# Patient Record
Sex: Female | Born: 1995
Health system: Southern US, Community
[De-identification: ages and names within clinical notes are randomized; demographics above are authoritative.]

## PROBLEM LIST (undated history)

## (undated) DIAGNOSIS — R569 Unspecified convulsions: Secondary | ICD-10-CM

## (undated) HISTORY — DX: Unspecified convulsions: R56.9

## (undated) HISTORY — PX: EYE SURGERY: SHX253

---
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
## 2015-03-07 ENCOUNTER — Other Ambulatory Visit: Payer: Self-pay | Admitting: Otolaryngology

## 2015-03-07 DIAGNOSIS — R42 Dizziness and giddiness: Secondary | ICD-10-CM

## 2015-03-12 ENCOUNTER — Ambulatory Visit
Admission: RE | Admit: 2015-03-12 | Discharge: 2015-03-12 | Disposition: A | Discharge status: Home / Self Care | Payer: BLUE CROSS/BLUE SHIELD | Source: Ambulatory Visit | Attending: Otolaryngology | Admitting: Otolaryngology

## 2015-03-12 DIAGNOSIS — R42 Dizziness and giddiness: Secondary | ICD-10-CM | POA: Insufficient documentation

## 2015-03-12 DIAGNOSIS — J3489 Other specified disorders of nose and nasal sinuses: Secondary | ICD-10-CM | POA: Diagnosis not present

## 2015-03-12 MED ORDER — GADOBENATE DIMEGLUMINE 529 MG/ML IV SOLN
20.0000 mL | Freq: Once | INTRAVENOUS | Status: AC | PRN
  Administered 2015-03-12: 20 mL via INTRAVENOUS

## 2015-05-17 DIAGNOSIS — G40909 Epilepsy, unspecified, not intractable, without status epilepticus: Secondary | ICD-10-CM | POA: Insufficient documentation

## 2015-05-23 ENCOUNTER — Ambulatory Visit: Payer: BLUE CROSS/BLUE SHIELD | Admitting: Physical Therapy

## 2015-05-30 ENCOUNTER — Ambulatory Visit: Payer: BLUE CROSS/BLUE SHIELD | Attending: Neurology | Admitting: Physical Therapy

## 2015-05-30 ENCOUNTER — Encounter: Payer: Self-pay | Admitting: Physical Therapy

## 2015-05-30 DIAGNOSIS — R2689 Other abnormalities of gait and mobility: Secondary | ICD-10-CM | POA: Diagnosis present

## 2015-05-30 DIAGNOSIS — R42 Dizziness and giddiness: Secondary | ICD-10-CM | POA: Diagnosis present

## 2015-05-30 NOTE — Addendum Note (Signed)
Addended by: Leanor Rubenstein on: 05/30/2015 05:16 PM   Modules accepted: Orders

## 2015-05-30 NOTE — Therapy (Addendum)
[place] [facility] MAIN [facility] SERVICES [address] [place], [place], [address] Phone: [phone]   Fax:  [phone]  Physical Therapy Evaluation  Patient Details  Name: Jacqueline Krueger MRN: [id number] Date of Birth: 1996-02-09 Referring Provider:  Lonell Face, MD  Encounter Date: 05/30/2015      PT End of Session - 05/30/15 1559    Visit Number 1   Number of Visits 8   Date for PT Re-Evaluation 07/25/15   PT Start Time 1105   PT Stop Time 1202   PT Time Calculation (min) 57 min   Equipment Utilized During Treatment Gait belt   Activity Tolerance Patient tolerated treatment well   Behavior During Therapy [facility] for tasks assessed/performed      Past Medical History  Diagnosis Date  . Seizures     History reviewed. No pertinent past surgical history.  There were no vitals filed for this visit.  Visit Diagnosis:  Dizziness and giddiness - Plan: PT plan of care cert/re-cert  Imbalance - Plan: PT plan of care cert/re-cert      Subjective Assessment - 05/30/15 1645    Subjective Pt reports that she can be lying still and the dizziness comes on. Pt's mother reports that pt can get dizzy walking into stores and needs help to walk. Pt reports last seizure was when she was 3 or 19 years old. Pt was diagnosed with Grand mal seizures. Pt use to get headaches and use to take medication for migraines so this medication was stopped due to heart issues. Pt reports difficulties with bending, standing, walking and standing up. Pt reports her symptoms get worse as the day progresses. Pt reports that her legs get "spasy" when she tries to exercise.   Patient is accompained by: Family member  mother   Diagnostic tests MRI brain 03/12/15 that was normal per MR. VNG testing 03/07/15 that indicated central findings per MR.  Pt reports she has an EEG test appt tomorrow.    Patient Stated Goals Pt would like to be able to work, exercise and go shopping.       VESTIBULAR AND BALANCE EVALUATION  Onset Date: age 47 or 5  HISTORY:  Symptoms at onset: Pt reports dizziness since the age of 89 or 5 and states it has gotten worse. Pt has not had any prior treatment for dizziness.  Pt reports that she can be lying still and the dizziness comes on. Pt's mother reports that pt can get dizzy walking into stores and needs help to walk. Pt reports last seizure was when she was 30 or 19 years old. Pt was diagnosed with Grand mal seizures. Pt use to get headaches and use to take medication for migraines so this medication was stopped due to heart issues. Pt reports difficulties with bending, standing, walking and standing up. Pt reports her symptoms get worse as the day progresses. Pt reports that her legs get "spasy" when she tries to exercise. Pt is going for EEG test tomorrow morning.   Description of dizziness: difficulty walking, vertigo, nausea, unsteadiness, lightheadedness Symptom nature: (motion provoked/positional/spontaneous/constant, variable, intermittent)  Frequency: reports 3 times this week she had episodes of dizziness Duration: lasts hours states until she goes to sleep   Provocative Factors: nothing that she knows of; once stood up yesterday Easing Factors: sleep (felt no difference since the patch usage) Progression of symptoms: (better, worse, no change since onset) Falls (yes/no): yes Number of falls in past 6 months:  one  fall going out the door denies dizziness at the time. Pt is afraid of falling.  Subjective Prior Functional Level: Pt does not drive due to dizziness. Pt reports independence with ADLs and at school. Pt does report that she gets leg spasms during showering at times and she calls for assistance.   Auditory complaints (tinnitus, pain, drainage): denies Vision: (last eye exam, diplopia, recent changes) blurry vision states it coincided with use of the scopolamine patch.   Current Symptoms: (vertigo, N & V, dysarthria, dysphagia,  drop attacks, bowel and bladder changes, recent weight loss/gain, lightheadedness, headache, rocking, general unsteadiness, imbalance, tilting, swaying, veering, dizzy, oscillopsia, migraines) Review of systems negative for red flags.   Prior LOF: Pt is independent community ambulator without AD and Independent with ADLs and activities at home. Pt has not gotten a drivers license yet due to her issues with dizziness. Pt lives with her family in a mobile home with no steps to enter.   EXAMINATION:   NEUROLOGICAL SCREEN:  B UEs and LEs myotome and dermatome testing WNL.    SOMATOSENSORY:        Sensation           Intact      Diminished         Absent  Light touch Normal     Any N & T in extremities or weakness: denies      COORDINATION:Finger to Nose:  Normal Past Pointing:   mild pass pointing bilaterally   MUSCULOSKELETAL SCREEN: Cervical Spine ROM: Cervical AROM grossly WNL without pain or crepitus.   ROM: WNL AROM B LEs and UEs.  MMT:  Grossly -5/5 or greater throughout except 4/5 bilateral shoulder flexion    Gait: Pt ambulates with fair cadence without AD without LOB or veering. However, pt ambulates with head looking down at floor with decreased arm swing. Pt with mild gait deviations with horiz head turns during ambulation.  Scanning of visual environment with gait is: decreased  Balance: Pt demonstrates imbalance with narrow BOS, ambulation with horiz head turns and uneven, compliant surfaces.       Clinical Test of Sensory Interaction for Balance    (CTSIB):  CONDITION TIME STRATEGY SWAY  Eyes open, firm surface 30 sec    Eyes closed, firm surface 30 sec ankle +1   Eyes open, foam surface 30 sec ankle   Eyes closed, foam surface 30 sec ankle +2    OCULOMOTOR / VESTIBULAR TESTING:  Oculomotor Exam- Room Light  Normal Abnormal Comments  Ocular Alignment N    Ocular ROM N    Spontaneous Nystagmus N    End-Gaze Nystagmus  ABN Persistent, downbeating  nystagmus noted at L and R mid to end range  Smooth Pursuit  ABN Saccadic throughout  Saccades  ABN Nystagmus noted and appears to go past target and correct.   VOR  ABN DIZZINESS AND NOTED DIFFICULTY MAINTAINING TARGET ESPECIALLY WITH HEAD TURNS TO THE RIGHT WITH SIGNIFICANT PASS POINTING NOTED  VOR Cancellation  ABN BLURRING OF TARGET AND DIZZINESS NOTED  Head Shaking Nystagmus N  No nystagmus observed; pt does report dizziness   BPPV TESTS: Deferred secondary to negative VNG for BPPV tests per MR performed 03/07/2015.   MEASURES:  Results Comments  DHI 92 Severe perception of handicap; in need of intervention  ABC Scale 18.1% High falls risk; in need of intervention  DGI 21/24 Falls risk; in need of intervention           [facility] PT  Assessment - 05/30/15 0001    Assessment   Medical Diagnosis dizziness   Prior Therapy none   Precautions   Precautions Fall   Balance Screen   Has the patient fallen in the past 6 months Yes   How many times? 1            PT Education - 05/30/15 1558    Education provided Yes   Education Details Discussed and demonstrated VOR X 1. Added VOR X 1 and feet together on pillow with vert head turns to HEP and handout provided.    Person(s) Educated Patient;Parent(s)   Methods Explanation;Demonstration;Handout   Comprehension Verbalized understanding;Returned demonstration             PT Long Term Goals - 05/30/15 1604    PT LONG TERM GOAL #1   Title Patient will reduce falls risk as indicated by Activities Specific Balance Confidence Scale (ABC) >67%.   Baseline 05/30/15: scored 18.1%    Time 8   Period Weeks   Status New   PT LONG TERM GOAL #2   Title Patient will reduce perceived disability to moderate levels as indicated by <70 on Dizziness Handicap Inventory [facility]).   Baseline 05/30/2015: 92 severe perception of handicap   Time 8   Period Weeks   Status New   PT LONG TERM GOAL #3   Title Patient will be able to perform home  program independently for self-management.   Time 8   Period Weeks   Status New   PT LONG TERM GOAL #4   Title Patient reports no vertigo with provoking motions or positions.   Time 8   Period Weeks   Status New   PT LONG TERM GOAL #5   Title Patient will demonstrate reduced falls risk as evidenced by Dynamic Gait Index (DGI) 22/24 or greater.   Time 8   Period Weeks   Status New               Plan - 05/30/15 1602    Clinical Impression Statement Pt presents with listed deficits and several potential indicators of central signs with vestibular testing. Pt with evidence of imbalance with narrow BOS, uneven surfaces and ambulation with head turn activities. Pt is at risk for falls and would benefit from PT services to address deficits and goals as set on POC.     Pt will benefit from skilled therapeutic intervention in order to improve on the following deficits Decreased balance;Dizziness;Decreased coordination   Rehab Potential Fair   Clinical Impairments Affecting Rehab Potential Positive indicators; supportive mother, motivated  Negative indicators: potential central signs, chronicity, stressors   PT Frequency 1x / week   PT Duration 8 weeks   PT Treatment/Interventions Vestibular;Canalith Repostioning;Patient/family education;Neuromuscular re-education;Balance training   PT Next Visit Plan Review VOR X 1; work on progressions of gaze stability and vestibulo-ocular tests.    Consulted and Agree with Plan of Care Patient;Family member/caregiver   Family Member Consulted mother        Problem List There are no active problems to display for this patient.  Mardelle Matte PT, DPT Mardelle Matte 05/30/2015, 5:15 PM  [place] [facility] MAIN [facility] SERVICES [address] [place], [place], [address] Phone: [phone]   Fax:  [phone]

## 2015-06-03 ENCOUNTER — Ambulatory Visit: Payer: BLUE CROSS/BLUE SHIELD | Admitting: Physical Therapy

## 2015-06-10 ENCOUNTER — Encounter: Payer: Self-pay | Admitting: Physical Therapy

## 2015-06-10 ENCOUNTER — Ambulatory Visit: Payer: BLUE CROSS/BLUE SHIELD

## 2015-06-10 DIAGNOSIS — R42 Dizziness and giddiness: Secondary | ICD-10-CM

## 2015-06-10 DIAGNOSIS — R2689 Other abnormalities of gait and mobility: Secondary | ICD-10-CM

## 2015-06-10 NOTE — Patient Instructions (Addendum)
Feet Together, Head Motion - Eyes Open   With eyes open, feet together, move head slowly: left and right. Eyes should follow head. Perform for 60 seconds Repeat _3__ times per session. Do __2__ sessions per day.  Also issued: Seated horizontal saccades 60 seconds x 3, twice/day  Modified VOR x 1 horizontal to NBOS standing

## 2015-06-10 NOTE — Therapy (Signed)
[place] [facility] MAIN [facility] SERVICES [address] [place], [place], [address] Phone: [phone]   Fax:  [phone]  Physical Therapy Treatment  Patient Details  Name: Jerzy Crotteau MRN: [id number] Date of Birth: [birth year] Referring Lennin Osmond:  Lonell Face, MD  Encounter Date: 06/10/2015      PT End of Session - 06/10/15 1357    Visit Number 2   Number of Visits 8   Date for PT Re-Evaluation 07/25/15   PT Start Time 1435   PT Stop Time 1515   PT Time Calculation (min) 40 min   Equipment Utilized During Treatment Gait belt   Activity Tolerance Patient tolerated treatment well   Behavior During Therapy [facility] for tasks assessed/performed      Past Medical History  Diagnosis Date  . Seizures     History reviewed. No pertinent past surgical history.  There were no vitals filed for this visit.  Visit Diagnosis:  Dizziness and giddiness  Imbalance      Subjective Assessment - 06/10/15 1357    Subjective Pt states that she had the EEG performed since the initial evaluation but was not provided the results. Pt is unsure if and when she will receive the results and does not have any follow-up appointments scheduled with her MD. Pt states she was able to perform HEP twice/day with some reported dizziness. Pt reports 4/10 dizziness with VOR x 1. No specific questions or concerns at this time.    Patient is accompained by: Family member  mother   Pertinent History Pt reports that she can be lying still and the dizziness comes on. Pt's mother reports that pt can get dizzy walking into stores and needs help to walk. Pt reports last seizure was when she was 19 or 19 years old. Pt was diagnosed with Grand mal seizures. Pt use to get headaches and use to take medication for migraines so this medication was stopped due to heart issues. Pt reports difficulties with bending, standing, walking and standing up. Pt reports her symptoms get worse as the day  progresses. Pt reports that her legs get "spasy" when she tries to exercise.   Diagnostic tests MRI brain 03/12/15 that was normal per MR. VNG testing 03/07/15 that indicated central findings per MR.  Pt reports she has an EEG test appt tomorrow.    Patient Stated Goals Pt would like to be able to work, exercise and go shopping.    Currently in Pain? No/denies       TREATMENT  VOR Performed VOR x 1 horizontal in sitting x 60 seconds to review with patient. Patient demonstrates good form and technique. Reports 4/10 dizziness; Performed VOR x 1 horizontal NBOS on Airex pad 60 seconds x 2 (6/10);  AIREX NBOS horizontal and vertical head turns 60 sec each, NBOS horizontal head and body turns 60 seconds x 2;  EYE EXERCISES Performed horizontal saccades between 2 "x" marks on the wall with 1-2 second holds on each mark 60 seconds x 2; Horizontal followed by vertical smooth pursuit with eyes x 60 seconds each;  AMBULATION Performed forward and retro ambulation with quick head turns reading playing cards on wall x 2 laps each; Pt has difficulty focusing on cards and naming correctly (pt requires instruction on suits on cards as she does not play with cards); Forward ambulation with vertical and then lateral ball toss;  Education regarding HEP progression.  PT Education - 06/10/15 1357    Education provided Yes   Education Details Progression of HEP   Person(s) Educated Patient   Methods Explanation;Demonstration;Handout   Comprehension Verbalized understanding;Returned demonstration             PT Long Term Goals - 05/30/15 1604    PT LONG TERM GOAL #1   Title Patient will reduce falls risk as indicated by Activities Specific Balance Confidence Scale (ABC) >67%.   Baseline 05/30/15: scored 18.1%    Time 8   Period Weeks   Status New   PT LONG TERM GOAL #2   Title Patient will reduce perceived disability to moderate levels as indicated  by <70 on Dizziness Handicap Inventory [facility]).   Baseline 05/30/2015: 92 severe perception of handicap   Time 8   Period Weeks   Status New   PT LONG TERM GOAL #3   Title Patient will be able to perform home program independently for self-management.   Time 8   Period Weeks   Status New   PT LONG TERM GOAL #4   Title Patient reports no vertigo with provoking motions or positions.   Time 8   Period Weeks   Status New   PT LONG TERM GOAL #5   Title Patient will demonstrate reduced falls risk as evidenced by Dynamic Gait Index (DGI) 22/24 or greater.   Time 8   Period Weeks   Status New               Plan - 06/10/15 1358    Clinical Impression Statement Pt demonstrates very poor smooth pursuit and saccades. She reports 6/10 dizziness with standing VOR on Airex pad. Pt issued additional exercises to HEP and encouraged to follow-up as scheduled.    Pt will benefit from skilled therapeutic intervention in order to improve on the following deficits Decreased balance;Dizziness;Decreased coordination   Rehab Potential Fair   Clinical Impairments Affecting Rehab Potential Positive indicators; supportive mother, motivated  Negative indicators: potential central signs, chronicity, stressors   PT Frequency 1x / week   PT Duration 8 weeks   PT Treatment/Interventions Vestibular;Canalith Repostioning;Patient/family education;Neuromuscular re-education;Balance training   PT Next Visit Plan Assess dynamic visual acuity on snellen eye chart with head turns. Provide progressions of gaze stability.   PT Home Exercise Plan VOR x 1 horizontal NBOS, NBOS horizontal head turns, standing horizontal saccades between 2 objects.    Consulted and Agree with Plan of Care Patient;Family member/caregiver   Family Member Consulted mother        Problem List There are no active problems to display for this patient.  Lynnea Maizes PT, DPT   Huprich,Jason 06/10/2015, 4:50 PM  [place] [facility] MAIN [facility] SERVICES [address] [place], [place], [address] Phone: [phone]   Fax:  [phone]

## 2015-06-17 ENCOUNTER — Ambulatory Visit: Payer: BLUE CROSS/BLUE SHIELD | Attending: Neurology

## 2015-06-17 DIAGNOSIS — R2689 Other abnormalities of gait and mobility: Secondary | ICD-10-CM | POA: Insufficient documentation

## 2015-06-17 DIAGNOSIS — R42 Dizziness and giddiness: Secondary | ICD-10-CM | POA: Diagnosis not present

## 2015-06-17 NOTE — Therapy (Signed)
[facility] MAIN [facility] SERVICES [address] [place], [place], [address] Phone: [phone]   Fax:  [phone]  Physical Therapy Treatment  Patient Details  Name: Jacqueline Krueger MRN: [id number] Date of Birth: 07/27/1996 Referring Provider:  Lonell Face, MD  Encounter Date: 06/17/2015      PT End of Session - 06/17/15 1424    Visit Number 3   Number of Visits 8   Date for PT Re-Evaluation 07/25/15   PT Start Time 1430   PT Stop Time 1515   PT Time Calculation (min) 45 min   Equipment Utilized During Treatment Gait belt   Activity Tolerance Patient tolerated treatment well   Behavior During Therapy [facility] for tasks assessed/performed      Past Medical History  Diagnosis Date  . Seizures (HCC)     History reviewed. No pertinent past surgical history.  There were no vitals filed for this visit.  Visit Diagnosis:  Dizziness and giddiness  Imbalance      Subjective Assessment - 06/17/15 1423    Subjective Pt reports she is doing well today without reported dizziness at rest. She is performing HEP without issue and has integrated the new exercises into her program. No specific questions or concerns at this time.    Patient is accompained by: Family member  sister   Pertinent History Pt reports that she can be lying still and the dizziness comes on. Pt's mother reports that pt can get dizzy walking into stores and needs help to walk. Pt reports last seizure was when she was 22 or 19 years old. Pt was diagnosed with Grand mal seizures. Pt use to get headaches and use to take medication for migraines so this medication was stopped due to heart issues. Pt reports difficulties with bending, standing, walking and standing up. Pt reports her symptoms get worse as the day progresses. Pt reports that her legs get "spasy" when she tries to exercise.   Diagnostic tests MRI brain 03/12/15 that was normal per MR. VNG testing 03/07/15 that indicated  central findings per MR.  Pt reports she has an EEG test appt tomorrow.    Patient Stated Goals Pt would like to be able to work, exercise and go shopping.    Currently in Pain? No/denies        TREATMENT  VOR Performed VOR x 1 horizontal NBOS on Airex pad with conflicting background 60 seconds x 2 (3/10), cues to increase speed as first repetition pt reports 0/10 dizziness. Pt denies blurring or shifting of her visual field; VOR x 1 with forward and retro ambulation in gym 35' x 2 (5/10); Pt demonstrates mild to moderate lateral gait deviations with change in cadence and speed with balance disruption;  VOR x 2 horizontal x 60 seconds (3/10);  AIREX NBOS horizontal and vertical head turns x 30 sec each, eyes closed static x 30 seconds, NBOS horizontal head and body turns x 30 seconds; marching with horizontal head turns x 30 seconds;  EYE EXERCISES Performed horizontal saccades between 2 "x" marks on the wall with 1-2 second holds on each mark 60 seconds, vertical saccades x 60 seconds, diagonal saccades 60 seconds x 2; significant nystagmus noted during horizontal and diagonal exercises. Minimal noted during vertical gaze.   Assessed static and dynamic visual acuity: 20/13 static visual acuity, 20/50 dynamic visual acuity  AMBULATION Performed forward and retro ambulation with quick head turns reading playing cards on wall x 2 laps each; Pt with  less difficulty focusing on cards and naming correctly on this date. Forward ambulation with vertical and then lateral ball toss; retro ambulation with ball pass over shoulder at varying heights (5/10). Ambulation in hallway with quick turns, LOB with therapist correction during quick turns.  Education regarding HEP progression.                           PT Education - 06/17/15 1424    Education provided Yes   Education Details HEP Progression   Person(s) Educated Patient   Methods Explanation;Demonstration;Handout    Comprehension Verbalized understanding;Returned demonstration             PT Long Term Goals - 05/30/15 1604    PT LONG TERM GOAL #1   Title Patient will reduce falls risk as indicated by Activities Specific Balance Confidence Scale (ABC) >67%.   Baseline 05/30/15: scored 18.1%    Time 8   Period Weeks   Status New   PT LONG TERM GOAL #2   Title Patient will reduce perceived disability to moderate levels as indicated by <70 on Dizziness Handicap Inventory [facility]).   Baseline 05/30/2015: 92 severe perception of handicap   Time 8   Period Weeks   Status New   PT LONG TERM GOAL #3   Title Patient will be able to perform home program independently for self-management.   Time 8   Period Weeks   Status New   PT LONG TERM GOAL #4   Title Patient reports no vertigo with provoking motions or positions.   Time 8   Period Weeks   Status New   PT LONG TERM GOAL #5   Title Patient will demonstrate reduced falls risk as evidenced by Dynamic Gait Index (DGI) 22/24 or greater.   Time 8   Period Weeks   Status New               Plan - 06/17/15 1425    Clinical Impression Statement Pt reports gradually improving dizziness at home with HEP. She reports at worst 5/10 dizziness and this occurs with retroambulation with ball passes over shoulder as well as with retro ambulation VOR x 1 horizontal. >3 line difference in static vs dynamic visual acuity. Pt issued progression of HEP to include foam surface and conflicting background. Will progress HEP at next visit if patient tolerates current program. Pt will continue to benefit from skilled PT services to address deficits and improve function at home.    Pt will benefit from skilled therapeutic intervention in order to improve on the following deficits Decreased balance;Dizziness;Decreased coordination   Rehab Potential Fair   Clinical Impairments Affecting Rehab Potential Positive indicators; supportive mother, motivated  Negative  indicators: potential central signs, chronicity, stressors   PT Frequency 1x / week   PT Duration 8 weeks   PT Treatment/Interventions Vestibular;Canalith Repostioning;Patient/family education;Neuromuscular re-education;Balance training   PT Next Visit Plan Progression of adaptation and habituation exercises. Issue VOR with ambulation at next visit for HEP if patient able to safely perform. Continue quick turns   PT Home Exercise Plan VOR x 1 horizontal NBOS foam with conflicting background, NBOS foam with horizontal head and body turns, seated diagonal saccades between 2 objects.    Consulted and Agree with Plan of Care Patient;Family member/caregiver   Family Member Consulted mother        Problem List There are no active problems to display for this patient.  Sharalyn Ink Huprich PT,  DPT   Huprich,Jason 06/17/2015, 3:59 PM  [facility] MAIN [facility] SERVICES [address] [place], [place], [address] Phone: [phone]   Fax:  [phone]

## 2015-06-24 ENCOUNTER — Ambulatory Visit: Payer: BLUE CROSS/BLUE SHIELD | Admitting: Physical Therapy

## 2015-06-25 ENCOUNTER — Ambulatory Visit: Payer: BLUE CROSS/BLUE SHIELD

## 2015-06-25 DIAGNOSIS — R42 Dizziness and giddiness: Secondary | ICD-10-CM

## 2015-06-25 DIAGNOSIS — R2689 Other abnormalities of gait and mobility: Secondary | ICD-10-CM

## 2015-06-25 NOTE — Therapy (Signed)
[facility] MAIN [facility] SERVICES [address] [place], [place], [address] Phone: [phone]   Fax:  [phone]  Physical Therapy Treatment  Patient Details  Name: Jacqueline Krueger MRN: [id number] Date of Birth: Jan 11, 1996 Referring Provider:  Lonell Face, MD  Encounter Date: 06/25/2015      PT End of Session - 06/25/15 1041    Visit Number 4   Number of Visits 8   Date for PT Re-Evaluation 07/25/15   PT Start Time 1038   PT Stop Time 1120   PT Time Calculation (min) 42 min   Equipment Utilized During Treatment Gait belt   Activity Tolerance Patient tolerated treatment well   Behavior During Therapy [facility] for tasks assessed/performed      Past Medical History  Diagnosis Date  . Seizures (HCC)     History reviewed. No pertinent past surgical history.  There were no vitals filed for this visit.  Visit Diagnosis:  Dizziness and giddiness  Imbalance      Subjective Assessment - 06/25/15 1040    Subjective Pt states she is doing well on this date. She is performing HEP without issue and reports positive reproduction of dizziness. Pt states that her symptoms feels about hte same as when she started therapy. No specific questions or concerns at this time.    Patient is accompained by: Family member  sister   Pertinent History Pt reports that she can be lying still and the dizziness comes on. Pt's mother reports that pt can get dizzy walking into stores and needs help to walk. Pt reports last seizure was when she was 27 or 19 years old. Pt was diagnosed with Grand mal seizures. Pt use to get headaches and use to take medication for migraines so this medication was stopped due to heart issues. Pt reports difficulties with bending, standing, walking and standing up. Pt reports her symptoms get worse as the day progresses. Pt reports that her legs get "spasy" when she tries to exercise.   Diagnostic tests MRI brain 03/12/15 that was normal per  MR. VNG testing 03/07/15 that indicated central findings per MR.  Pt reports she has an EEG test appt tomorrow.    Patient Stated Goals Pt would like to be able to work, exercise and go shopping.        TREATMENT  VOR Performed VOR x 1 horizontal NBOS on Airex pad with conflicting background 60 seconds x 2 (5/10), good speed and sequencing noted. Pt denies blurring or shifting of her visual field; VOR x 1 vertical NBOS Airex pad x 60 seconds (5/10); VOR x 1 horizontal with forward and retro ambulation in gym 35' x 2 (3/10); VOR x 1 vertical with forward and retro ambulation in gym 35' x 2 (5/10); Pt demonstrates mild lateral gait deviations with change in cadence and speed with balance disruption; worse with vertical;  DYNAMIC GAZE STABILIZATION Forward and retro ambulation in hallway with quick head turns to read playing cards (3/10)  AMBULATION Forward ambulation with vertical and then lateral ball toss; retro ambulation with vertical ball toss; retro ambulation with ball pass over shoulder at varying heights (5/10). Ambulation in hallway with quick 180 degree turns in both directions, no gross LOB noted today but pt consistently requires 1 step to self-correct.  BALL TOSS Standing in doorway of lifestyle center ball toss from side to side with quick head turns following with eyes 30 seconds x 3, added high to low progression; Pt denies dizziness  initially but with low to the R and high to the L reports 4/10 dizziness.   AIREX Forward and backward tandem gait on Airex balance beam, added horizontal head turns to tandem gait; side stepping on Airex balance beam with horizontal head turns;                           PT Education - 06/25/15 1040    Education provided Yes   Education Details HEP reinforced and progressed   Person(s) Educated Patient   Methods Explanation;Demonstration   Comprehension Verbalized understanding;Returned demonstration              PT Long Term Goals - 05/30/15 1604    PT LONG TERM GOAL #1   Title Patient will reduce falls risk as indicated by Activities Specific Balance Confidence Scale (ABC) >67%.   Baseline 05/30/15: scored 18.1%    Time 8   Period Weeks   Status New   PT LONG TERM GOAL #2   Title Patient will reduce perceived disability to moderate levels as indicated by <70 on Dizziness Handicap Inventory [facility]).   Baseline 05/30/2015: 92 severe perception of handicap   Time 8   Period Weeks   Status New   PT LONG TERM GOAL #3   Title Patient will be able to perform home program independently for self-management.   Time 8   Period Weeks   Status New   PT LONG TERM GOAL #4   Title Patient reports no vertigo with provoking motions or positions.   Time 8   Period Weeks   Status New   PT LONG TERM GOAL #5   Title Patient will demonstrate reduced falls risk as evidenced by Dynamic Gait Index (DGI) 22/24 or greater.   Time 8   Period Weeks   Status New               Plan - 06/25/15 1041    Clinical Impression Statement Pt reports less severe dizziness during therapy today but still notable and persistent. Provided progression of HEP on this date to include VOR x 1 horizontal with forward/retro ambulation. Pt encouraged to follow-up as scheduled.   Pt will benefit from skilled therapeutic intervention in order to improve on the following deficits Decreased balance;Dizziness;Decreased coordination   Rehab Potential Fair   Clinical Impairments Affecting Rehab Potential Positive indicators; supportive mother, motivated  Negative indicators: potential central signs, chronicity, stressors   PT Frequency 1x / week   PT Duration 8 weeks   PT Treatment/Interventions Vestibular;Canalith Repostioning;Patient/family education;Neuromuscular re-education;Balance training   PT Next Visit Plan Progression of adaptation and habituation exercises.    PT Home Exercise Plan VOR x 1 horizontal with forward/retro  ambulation, NBOS foam with horizontal head and body turns, seated diagonal saccades between 2 objects.    Consulted and Agree with Plan of Care Patient;Family member/caregiver   Family Member Consulted mother        Problem List There are no active problems to display for this patient.  Lynnea Maizes PT, DPT   Huprich,Jason 06/25/2015, 2:55 PM  [facility] MAIN [facility] SERVICES [address] [place], [place], [address] Phone: [phone]   Fax:  [phone]

## 2015-07-01 ENCOUNTER — Encounter: Payer: Self-pay | Admitting: Physical Therapy

## 2015-07-01 ENCOUNTER — Ambulatory Visit: Payer: BLUE CROSS/BLUE SHIELD | Admitting: Physical Therapy

## 2015-07-01 DIAGNOSIS — R2689 Other abnormalities of gait and mobility: Secondary | ICD-10-CM

## 2015-07-01 DIAGNOSIS — R42 Dizziness and giddiness: Secondary | ICD-10-CM | POA: Diagnosis not present

## 2015-07-01 NOTE — Therapy (Signed)
[facility]IN [facility] [address] [place], [place], [address] Phone: [phone]   Fax:  [phone]  Physical Therapy Treatment  Patient Details  Name: Ned ClinesSamantha Boeckman MRN: [id number] Date of Birth: 01/05/1996 No Data Recorded  Encounter Date: 07/01/2015      PT End of Session - 07/01/15 1529    Visit Number 5   Number of Visits 8   Date for PT Re-Evaluation 07/25/15   PT Start Time 1132   PT Stop Time 1215   PT Time Calculation (min) 43 min   Equipment Utilized During Treatment Gait belt   Activity Tolerance Patient tolerated treatment well   Behavior During Therapy [facility] for tasks assessed/performed;Flat affect      Past Medical History  Diagnosis Date  . Seizures (HCC)     History reviewed. No pertinent past surgical history.  There were no vitals filed for this visit.  Visit Diagnosis:  Dizziness and giddiness  Imbalance      Subjective Assessment - 07/01/15 1142    Subjective Pt reports no difference in her symptoms, reports same intensity and frequency. Pt reports she has been doing the exercises daily and that it is reproducing her symptoms. Pt reports that she gets dizzy when she walks on the treadmill but states the dizziness is not consistent.  Pt states she reads her Ipad while on the treadmill.   Patient is accompained by: Family member  sister   Pertinent History Pt reports that she can be lying still and the dizziness comes on. Pt's mother reports that pt can get dizzy walking into stores and needs help to walk. Pt reports last seizure was when she was 89 or 19 years old. Pt was diagnosed with Grand mal seizures. Pt use to get headaches and use to take medication for migraines so this medication was stopped due to heart issues. Pt reports difficulties with bending, standing, walking and standing up. Pt reports her symptoms get worse as the day progresses. Pt reports that her legs get "spasy" when she tries to  exercise.   Diagnostic tests MRI brain 03/12/15 that was normal per MR. VNG testing 03/07/15 that indicated central findings per MR.  Pt reports she has an EEG test appt tomorrow.    Patient Stated Goals Pt would like to be able to work, exercise and go shopping.       Neuromuscular Re-education:  2" X 4" board:   On 2" X 4" board worked on static sideways stance with and without horiz and vert head turns. On 2" X 4" board worked on sidestepping L/R  and then forward tandem walking with and without horiz and vert head turns 8' times 8 reps of each type.    Airex pad: On firm surface and then on Airex pad, performed marching with alternating UE reaching up (mountain climbers) with and without horiz head turns.  Performed on Airex pad, EC with feet together 30 sec holds.          PT Education - 07/01/15 1528    Education provided Yes   Education Details HEP reinforced, reviewed and progressed. Issued exercises # 1,2,5,7,15 and 17.   Person(s) Educated Patient   Methods Explanation;Handout;Demonstration   Comprehension Verbalized understanding             PT Long Term Goals - 07/01/15 1530    PT LONG TERM GOAL #1   Title Patient will reduce falls risk as indicated by Activities Specific Balance Confidence  Scale (ABC) >67%.   Baseline 05/30/15: scored 18.1%    Time 8   Period Weeks   Status On-going   PT LONG TERM GOAL #2   Title Patient will reduce perceived disability to moderate levels as indicated by <70 on Dizziness Handicap Inventory [facility]).   Baseline 05/30/2015: 92 severe perception of handicap   Time 8   Period Weeks   Status On-going   PT LONG TERM GOAL #3   Title Patient will be able to perform home program independently for self-management.   Time 8   Period Weeks   Status Achieved   PT LONG TERM GOAL #4   Title Patient reports no vertigo with provoking motions or positions.   Time 8   Period Weeks   Status On-going   PT LONG TERM GOAL #5   Title Patient  will demonstrate reduced falls risk as evidenced by Dynamic Gait Index (DGI) 22/24 or greater.   Time 8   Period Weeks   Status On-going               Plan - 07/01/15 1530    Clinical Impression Statement Pt reports no changes in her symptoms this past week. Pt reports 4-5/10 dizziness with actvities this date. Pt challenged by activities on 2" X 4" baord. Added balance exercise progressions for HEP. Continue PT services per POC to work towards goals.    Pt will benefit from skilled therapeutic intervention in order to improve on the following deficits Decreased balance;Dizziness;Decreased coordination   Rehab Potential Fair   Clinical Impairments Affecting Rehab Potential Positive indicators; supportive mother, motivated  Negative indicators: potential central signs, chronicity, stressors   PT Frequency 1x / week   PT Duration 8 weeks   PT Treatment/Interventions Vestibular;Canalith Repostioning;Patient/family education;Neuromuscular re-education;Balance training   PT Home Exercise Plan VOR x 1 horizontal with forward/retro ambulation, NBOS foam with horizontal head and body turns, seated diagonal saccades between 2 objects. Narrow BOS on foam with EC, mountain climbers with horiz head turns on foam (alternating marching while reaching upward with opposite UE), ambulaiton in hallway with horiz head and vert head turns.    Consulted and Agree with Plan of Care Patient;Family member/caregiver        Problem List There are no active problems to display for this patient.  Mardelle Matte PT, DPT Mardelle Matte 07/02/2015, 12:25 PM  [place] [facility] MAIN [facility] SERVICES [address] [place], [place], [address] Phone: [phone]   Fax:  [phone]  Name: Oluwasemilore Pascuzzi MRN: [id number] Date of Birth: 01/07/1996

## 2015-07-08 ENCOUNTER — Encounter: Payer: Self-pay | Admitting: Physical Therapy

## 2015-07-08 ENCOUNTER — Ambulatory Visit: Payer: BLUE CROSS/BLUE SHIELD | Admitting: Physical Therapy

## 2015-07-08 DIAGNOSIS — R42 Dizziness and giddiness: Secondary | ICD-10-CM

## 2015-07-08 DIAGNOSIS — R2689 Other abnormalities of gait and mobility: Secondary | ICD-10-CM

## 2015-07-08 NOTE — Therapy (Signed)
[place] MAIN [facility] SERVICES [address] [place], [place], [address] Phone: [phone]   Fax:  [phone]  Physical Therapy Treatment  Patient Details  Name: Awanda Wilcock MRN: [id number] Date of Birth: [birth year] No Data Recorded  Encounter Date: 07/08/2015      PT End of Session - 07/08/15 1400    Visit Number 6   Number of Visits 8   Date for PT Re-Evaluation 07/25/15   PT Start Time 0086   PT Stop Time 1432   PT Time Calculation (min) 44 min   Equipment Utilized During Treatment Gait belt   Activity Tolerance Patient tolerated treatment well   Behavior During Therapy [facility] for tasks assessed/performed;Flat affect      Past Medical History  Diagnosis Date  . Seizures ([place])     History reviewed. No pertinent past surgical history.  There were no vitals filed for this visit.  Visit Diagnosis:  Dizziness and giddiness  Imbalance      Subjective Assessment - 07/08/15 1352    Subjective Pt reports that she did not have any dizziness this past week and she states "it has been good". Pt reports she has been doing HEP every day or every other day. Pt states that she feels like she could start working now. Pt reports playing the Play [employer] or looking at the I Pad for too long will bring on dizziness.    Pertinent History Pt reports that she can be lying still and the dizziness comes on. Pt's mother reports that pt can get dizzy walking into stores and needs help to walk. Pt reports last seizure was when she was 89 or 19 years old. Pt was diagnosed with Grand mal seizures. Pt use to get headaches and use to take medication for migraines so this medication was stopped due to heart issues. Pt reports difficulties with bending, standing, walking and standing up. Pt reports her symptoms get worse as the day progresses. Pt reports that her legs get "spasy" when she tries to exercise.   Diagnostic tests MRI brain 03/12/15 that was normal per  MR. VNG testing 03/07/15 that indicated central findings per MR.  Pt reports she has an EEG test appt tomorrow.    Patient Stated Goals Pt would like to be able to work, exercise and go shopping.         Neuromuscular Re-education:  Habituation exercises: Worked on seated habituation exercise of nose towards left knee 10 reps and nose towards right knee 10 reps with return upright with head turned upwards and sitting with horiz head turns L/R 10 reps. Pt reports 5/10 dizziness with all habituation exercises.   Active eye movement between two targets: Pt performed active eye movements between two targets horiz 3 reps of 1 minute each. Pt reports mild dizziness with this activity.  Performed remembered/imaginary targets in horiz plane for about 2-4 minutes. Pt reports this did not cause dizziness and pt was accurate with attaining target.     Physical Performance: Performed DGI, ABC scale and DHI functional outcome measures Discussed test results and compared to prior testing. Discussed POC and goals.        PT Education - 07/08/15 1400    Education provided Yes   Education Details Reinforced HEP and added active eye movmeents between two targets and 3 habituation exercises to HEP.   Person(s) Educated Patient   Methods Explanation;Handout   Comprehension Verbalized understanding;Returned demonstration  PT Long Term Goals - 07/08/15 1504    PT LONG TERM GOAL #1   Title Patient will reduce falls risk as indicated by Activities Specific Balance Confidence Scale (ABC) >67%.   Baseline 05/30/15: scored 18.1%; scored 40.6% 10/24   Time 8   Period Weeks   Status Partially Met   PT LONG TERM GOAL #2   Title Patient will reduce perceived disability to moderate levels as indicated by <70 on Dizziness Handicap Inventory [facility]).   Baseline 05/30/2015: 92 severe perception of handicap; scored 70 on 10/24   Time 8   Period Weeks   Status Partially Met   PT LONG TERM GOAL #4    Title Patient reports no vertigo with provoking motions or positions.   Time 8   Period Weeks   Status Partially Met   PT LONG TERM GOAL #5   Title Patient will demonstrate reduced falls risk as evidenced by Dynamic Gait Index (DGI) 22/24 or greater.   Baseline scored 22/24 on 10/24   Time 8   Period Weeks   Status On-going               Plan - 07/08/15 1401    Clinical Impression Statement Pt reports that she did much better this past week and did not have any episodes of dizziness. Pt demonstrates improvements on goals as set on POC but has not achieved goals yet and would benefit from continued PT services to address goals and subjective symptoms of dizziness. Pt reports motion sensitivity issues and provided patient with habituation and vestibulo-ocular exercises this date to add to her HEP.    Pt will benefit from skilled therapeutic intervention in order to improve on the following deficits Decreased balance;Dizziness;Decreased coordination   Rehab Potential Fair   Clinical Impairments Affecting Rehab Potential Positive indicators; supportive mother, motivated  Negative indicators: potential central signs, chronicity, stressors   PT Frequency 1x / week   PT Duration 8 weeks   PT Treatment/Interventions Vestibular;Canalith Repostioning;Patient/family education;Neuromuscular re-education;Balance training   PT Next Visit Plan Review 4 new exercises that were added to HEP last visit. Continue to work on progressions of vestibular exercises next session.    PT Home Exercise Plan VOR x 1 horizontal with forward/retro ambulation, NBOS foam with horizontal head and body turns, seated diagonal saccades between 2 objects. Narrow BOS on foam with EC, mountain climbers with horiz head turns on foam (alternating marching while reaching upward with opposite UE), ambulaiton in hallway with horiz head and vert head turns.  Added active eye movements between two targets and knee to nose L/R with  return upright and sitting head turns horiz habituation exercises.   Consulted and Agree with Plan of Care Patient        Problem List There are no active problems to display for this patient.  Lady Deutscher PT, DPT Lady Deutscher 07/08/2015, 3:13 PM  [place] MAIN [facility] SERVICES [address] [place], [place], [address] Phone: [phone]   Fax:  [phone]  Name: Nicolet Griffy MRN: [id number] Date of Birth: 12-29-1995

## 2015-07-15 ENCOUNTER — Ambulatory Visit: Payer: BLUE CROSS/BLUE SHIELD

## 2015-07-15 ENCOUNTER — Encounter: Payer: Self-pay | Admitting: Physical Therapy

## 2015-07-15 DIAGNOSIS — R42 Dizziness and giddiness: Secondary | ICD-10-CM | POA: Diagnosis not present

## 2015-07-15 DIAGNOSIS — R2689 Other abnormalities of gait and mobility: Secondary | ICD-10-CM

## 2015-07-15 NOTE — Therapy (Signed)
[place] MAIN [facility] SERVICES [address] [place], [place], [address] Phone: [phone]   Fax:  [phone]  Physical Therapy Treatment  Patient Details  Name: Jacqueline Krueger MRN: [id number] Date of Birth: 08-07-1996 No Data Recorded  Encounter Date: 07/15/2015      PT End of Session - 07/15/15 1041    Visit Number 7   Number of Visits 8   Date for PT Re-Evaluation 07/25/15   PT Start Time 3329   PT Stop Time 1113   PT Time Calculation (min) 38 min   Equipment Utilized During Treatment Gait belt   Activity Tolerance Patient tolerated treatment well   Behavior During Therapy [facility] for tasks assessed/performed;Flat affect      Past Medical History  Diagnosis Date  . Seizures ([place])     History reviewed. No pertinent past surgical history.  There were no vitals filed for this visit.  Visit Diagnosis:  Dizziness and giddiness  Imbalance      Subjective Assessment - 07/15/15 1039    Subjective Pt reports she has not had any dizziness since her last therapy session. She is performing HEP without issue including new exercises that were added at last session.. She does report some dizziness while performing head to knee vertical head turns. No specific questions or concerns.    Pertinent History Pt reports that she can be lying still and the dizziness comes on. Pt's mother reports that pt can get dizzy walking into stores and needs help to walk. Pt reports last seizure was when she was 58 or 19 years old. Pt was diagnosed with Grand mal seizures. Pt use to get headaches and use to take medication for migraines so this medication was stopped due to heart issues. Pt reports difficulties with bending, standing, walking and standing up. Pt reports her symptoms get worse as the day progresses. Pt reports that her legs get "spasy" when she tries to exercise.   Diagnostic tests MRI brain 03/12/15 that was normal per MR. VNG testing 03/07/15 that  indicated central findings per MR.  Pt reports she has an EEG test appt tomorrow.    Patient Stated Goals Pt would like to be able to work, exercise and go shopping.    Currently in Pain? No/denies   Multiple Pain Sites No      Neuromuscular Re-education: Habituation exercises: Reviewed 3 new exercises that were added to HEP last visit, pt unable to recall 4th exercise;  Worked on seated habituation exercise of nose towards left knee 10 reps and nose towards right knee 10 reps with return upright with head turned upwards and sitting with horiz head turns L/R 10 reps. Pt reports 6/10 dizziness; Active eye movement between two target with fixation followed by head turn and then repeated in opposite direction 1 minute x 2, pt denies dizziness with exercise;  Active eye movement between two targets: Pt performed active eye movements between two targets horiz 2 reps of 1 minute each. Pt reports mild dizziness with this activity.    VOR VOR x 1 horizontal with forward/retro ambulation x 35' each, 3 reps (4/10 increasing to 5/10 on last rep)  Airex NBOS vertical ball toss to self 60 seconds x 2; NBOS lateral ball toss from one hand to the other 60 seconds x 2 NBOS eyes closed 30 seconds x 2;  NBOS with horizontal head and body turns 30 seconds each; Balance beam side stepping with horizontal head turns x 4 laps; Balance beam  tandem balance with horizontal head turns x 30 seconds;                           PT Education - 07/15/15 1040    Education provided Yes   Education Details Reinforced HEP and performed with patient, no new exercises added   Person(s) Educated Patient   Methods Explanation;Demonstration   Comprehension Verbalized understanding;Returned demonstration             PT Long Term Goals - 07/08/15 1504    PT LONG TERM GOAL #1   Title Patient will reduce falls risk as indicated by Activities Specific Balance Confidence Scale (ABC) >67%.    Baseline 05/30/15: scored 18.1%; scored 40.6% 10/24   Time 8   Period Weeks   Status Partially Met   PT LONG TERM GOAL #2   Title Patient will reduce perceived disability to moderate levels as indicated by <70 on Dizziness Handicap Inventory [facility]).   Baseline 05/30/2015: 92 severe perception of handicap; scored 70 on 10/24   Time 8   Period Weeks   Status Partially Met   PT LONG TERM GOAL #4   Title Patient reports no vertigo with provoking motions or positions.   Time 8   Period Weeks   Status Partially Met   PT LONG TERM GOAL #5   Title Patient will demonstrate reduced falls risk as evidenced by Dynamic Gait Index (DGI) 22/24 or greater.   Baseline scored 22/24 on 10/24   Time 8   Period Weeks   Status On-going               Plan - 07/15/15 1041    Clinical Impression Statement Pt reporting considerable improvement in symptoms since starting therapy. She is considering looking for a job at this point. Pt still reports dizziness with VOR x 1 during ambulation as well as with Airex head turning activities and head to knee/ceiling exercise. Pt encouraged to continue HEP but no progression provided as she has an extensive HEP which was progressed just last visit. Pt encouraged to follow-up as scheduled. At next visit will repeat outcome measures and assess for recertification.    Pt will benefit from skilled therapeutic intervention in order to improve on the following deficits Decreased balance;Dizziness;Decreased coordination   Rehab Potential Fair   Clinical Impairments Affecting Rehab Potential Positive indicators; supportive mother, motivated  Negative indicators: potential central signs, chronicity, stressors   PT Frequency 1x / week   PT Duration 8 weeks   PT Treatment/Interventions Vestibular;Canalith Repostioning;Patient/family education;Neuromuscular re-education;Balance training   PT Next Visit Plan Complete outcome measures, recert if indicated. Progress vestibular  exercises.    PT Home Exercise Plan VOR x 1 horizontal with forward/retro ambulation, NBOS foam with horizontal head and body turns, seated diagonal saccades between 2 objects. Narrow BOS on foam with EC, mountain climbers with horiz head turns on foam (alternating marching while reaching upward with opposite UE), ambulaiton in hallway with horiz head and vert head turns.    Consulted and Agree with Plan of Care Patient;Family member/caregiver        Problem List There are no active problems to display for this patient.  Phillips Grout PT, DPT   Huprich,Jason 07/15/2015, 11:23 AM  [place] MAIN [facility] SERVICES [address] [place], [place], [address] Phone: [phone]   Fax:  [phone]  Name: Alura Olveda MRN: [id number] Date of Birth: [birth year]

## 2015-07-22 ENCOUNTER — Ambulatory Visit: Payer: BLUE CROSS/BLUE SHIELD | Attending: Neurology

## 2015-07-22 DIAGNOSIS — R2689 Other abnormalities of gait and mobility: Secondary | ICD-10-CM | POA: Diagnosis present

## 2015-07-22 DIAGNOSIS — R42 Dizziness and giddiness: Secondary | ICD-10-CM | POA: Insufficient documentation

## 2015-07-22 NOTE — Therapy (Signed)
[place] [facility] MAIN [facility] [name] [place], [place], [address] Phone: [phone]   Fax:  [phone]  Physical Therapy Treatment  Patient Details  Name: Jacqueline ClinesSamantha Kluge MRN: [id number] Date of Birth: 11/07/1995 No Data Recorded  Encounter Date: 07/22/2015      PT End of Session - 07/22/15 1400    Visit Number 8   Number of Visits 14   Date for PT Re-Evaluation 09/05/15   PT Start Time 1352   PT Stop Time 1430   PT Time Calculation (min) 38 min   Equipment Utilized During Treatment Gait belt   Activity Tolerance Patient tolerated treatment well   Behavior [facility] for tasks assessed/performed;Flat affect      Past Medical History  Diagnosis Date  . Seizures ([facility])     History reviewed. No pertinent past surgical history.  There were no vitals filed for this visit.  Visit Diagnosis:  Dizziness and giddiness - Plan: PT plan of care cert/re-cert  Imbalance - Plan: PT plan of care cert/re-cert      Subjective Assessment - 07/22/15 1353    Subjective Pt states she has continued to have dizziness since last therapy sessionn. She reports she is approximately 40% improved overall since starting therapy. HEP going well without questions/concerns.     Pertinent History Pt reports that she can be lying still and the dizziness comes on. Pt's mother reports that pt can get dizzy walking into stores and needs help to walk. Pt reports last seizure was when she was 89 or [address] years old. Pt was diagnosed with Grand mal seizures. Pt use to get headaches and use to take medication for migraines so this medication was stopped due to heart issues. Pt reports difficulties with bending, standing, walking and standing up. Pt reports her symptoms get worse as the day progresses. Pt reports that her legs get "spasy" when she tries to exercise.   Diagnostic tests MRI brain 03/12/15 that was normal per MR. VNG testing 03/07/15 that indicated central  findings per MR.  Pt reports she has an EEG test appt tomorrow.    Patient Stated Goals Pt would like to be able to work, exercise and go shopping.    Currently in Pain? No/denies            [facility] PT Assessment - 07/22/15 1404    Observation/Other Assessments   Other Surveys  Other Surveys   Activities of Balance Confidence Scale (ABC Scale)  28.75%   Dizziness Handicap Inventory (DHI)  72/100   Standardized Balance Assessment   Standardized Balance Assessment Dynamic Gait Index   Dynamic Gait Index   Level Surface Normal   Change in Gait Speed Normal   Gait with Horizontal Head Turns Normal   Gait with Vertical Head Turns Normal   Gait and Pivot Turn Mild Impairment   Step Over Obstacle Normal   Step Around Obstacles Normal   Steps Mild Impairment   Total Score 22   DGI comment: One small step after quick turn for balance, speed is adequate however. Slight lateral deviation with stairs.        Physical Performance Subjective obtained from patient; Pt completed ABC and DHI (unbilled); Performed DGI with patient; Calculated and discussed results of outcome measures. Goals updated with patient;   Neuromuscular Re-education:  Habituation exercises: Worked on seated habituation exercise of nose towards left knee 10 reps and nose towards right knee 10 reps with return upright with head turned  upwards L/R. Pt reports 4/10 dizziness to the L, 5/10 to the R; Active eye movement between two target with fixation followed by head turn and then repeated in opposite direction 1 minute x 2, pt denies dizziness with exercise;  Active eye movement between two targets: Pt performed active eye movements between two targets horiz 1 minute, vertical 1 minute. Pt denies dizziness at this time;  Stairs Ascend/descend stairs with cone tapping and cognitive tasks x 2 flights, pt with mild to moderate lateral deviations. Reports 6/10 dizziness;  VOR VOR x 1 horizontal with forward/retro  ambulation x 35' each, 3 reps (5/10)  Airex NBOS eyes closed x 30 seconds; NBOS vertical ball toss to self x 30 seconds; Eyes closed marching x 30 seconds;                       PT Education - 07/22/15 1400    Education provided Yes   Education Details Reinforced HEP   Person(s) Educated Patient   Methods Explanation;Demonstration   Comprehension Verbalized understanding;Returned demonstration             PT Long Term Goals - 07/22/15 1401    PT LONG TERM GOAL #1   Title Patient will reduce falls risk as indicated by Activities Specific Balance Confidence Scale (ABC) >67%.   Baseline 05/30/15: scored 18.1%; scored 40.6% 10/24, 07/22/15: 28.75%   Time 8   Period Weeks   Status On-going   PT LONG TERM GOAL #2   Title Patient will reduce perceived disability to moderate levels as indicated by <70 on Dizziness Handicap Inventory [facility]).   Baseline 05/30/2015: 92 severe perception of handicap; scored 70 on 10/24, 07/22/15: 72/100   Time 8   Period Weeks   Status On-going   PT LONG TERM GOAL #3   Title Patient will be able to perform home program independently for self-management.   Time 8   Status Achieved   PT LONG TERM GOAL #4   Title Patient reports no vertigo with provoking motions or positions.   Baseline 07/22/15: 2x/wk   Time 8   Period Weeks   Status On-going   PT LONG TERM GOAL #5   Title Patient will demonstrate reduced falls risk as evidenced by Dynamic Gait Index (DGI) 22/24 or greater.   Baseline scored 22/24 on 10/24, 07/22/15: 22/24   Time 8   Period Weeks   Status Achieved               Plan - 07/22/15 1400    Clinical Impression Statement Pt reports that she feels approximatley 40% improved in symptoms since starting therapy. Pt still reporting intermittent dizziness at home, approximately 2 episodes per week. Pt is considering applying for jobs after the first of the year. Pt with improvement in DHI and ABC scores since initial  evaluation. She is reporting decreased freuqency and severity of dizziness. No real change seen in DGI score however pt scored very high initially with 22/24 so not much improvement is to be expected. Pt continues to have symptoms which limit her function at home and would benefit from an additional 6 weeks of therapy in order to continue function at home and work toward goal of looking for employement after the start of the new year.    Pt will benefit from skilled therapeutic intervention in order to improve on the following deficits Decreased balance;Dizziness;Decreased coordination   Rehab Potential Fair   Clinical Impairments Affecting Rehab Potential Positive indicators;  supportive mother, motivated  Negative indicators: potential central signs, chronicity, stressors   PT Frequency 1x / week   PT Duration 6 weeks   PT Treatment/Interventions Vestibular;Canalith Repostioning;Patient/family education;Neuromuscular re-education;Balance training   PT Next Visit Plan Progress vestibular exercises.    PT Home Exercise Plan VOR x 1 horizontal with forward/retro ambulation, NBOS foam with horizontal head and body turns, seated diagonal saccades between 2 objects. Narrow BOS on foam with EC, mountain climbers with horiz head turns on foam (alternating marching while reaching upward with opposite UE), ambulaiton in hallway with horiz head and vert head turns.    Consulted and Agree with Plan of Care Patient;Family member/caregiver        Problem List There are no active problems to display for this patient.  Lynnea Maizes PT, DPT   Trennon Torbeck 07/22/2015, 4:07 PM  [place] [facility] MAIN [facility] SERVICES [address] [place], [place], [address] Phone: [phone]   Fax:  [phone]  Name: Thelma Lorenzetti MRN: [id number] Date of Birth: [birth year]

## 2015-07-29 ENCOUNTER — Ambulatory Visit: Payer: BLUE CROSS/BLUE SHIELD | Admitting: Physical Therapy

## 2015-07-29 ENCOUNTER — Encounter: Payer: Self-pay | Admitting: Physical Therapy

## 2015-07-29 DIAGNOSIS — R42 Dizziness and giddiness: Secondary | ICD-10-CM

## 2015-07-29 DIAGNOSIS — R2689 Other abnormalities of gait and mobility: Secondary | ICD-10-CM

## 2015-07-29 NOTE — Therapy (Signed)
[place] [facility] MAIN [facility] SERVICES [address] [place], [place], [address] Phone: [phone]   Fax:  [phone]  Physical Therapy Treatment  Patient Details  Name: Jacqueline Krueger MRN: [id number] Date of Birth: 05/19/1996 No Data Recorded  Encounter Date: 07/29/2015      PT End of Session - 07/29/15 1447    Visit Number 9   Number of Visits 14   Date for PT Re-Evaluation 09/05/15   PT Start Time 1055   PT Stop Time 1134   PT Time Calculation (min) 39 min   Activity Tolerance Patient tolerated treatment well  pt did require several short rest breaks secondary to dizziness symptoms.   Behavior During Therapy [facility] for tasks assessed/performed      Past Medical History  Diagnosis Date  . Seizures ([facility])     History reviewed. No pertinent past surgical history.  There were no vitals filed for this visit.  Visit Diagnosis:  Dizziness and giddiness  Imbalance      Subjective Assessment - 07/29/15 1058    Subjective Pt's mom reports that pt had a bad episode of dizziness last week that lasted two full days on Monday and Tuesday. Pt and her mom report that the pt slept for 15 hours and stayed in bed longer. Pt reports she felt nauseous and dizzy but had and no other symptoms and denies headaches. Pt states that her symptoms went back down after Mon and Tuesday of last week to where they were before this episode began. Pt's mother stating that she wants to know why her dtr is experiencing symptoms and expresses frustration that her dtr's physicians have not communicated a diagnosis for her dtr. (Encouraged patient to follow-up with Dr. Sherryll Burger in order to discuss their concerns.)    Patient is accompained by: Family member  mother   Pertinent History Pt reports that she can be lying still and the dizziness comes on. Pt's mother reports that pt can get dizzy walking into stores and needs help to walk. Pt reports last seizure was when she was 13 or 19  years old. Pt was diagnosed with Grand mal seizures. Pt use to get headaches and use to take medication for migraines so this medication was stopped due to heart issues. Pt reports difficulties with bending, standing, walking and standing up. Pt reports her symptoms get worse as the day progresses. Pt reports that her legs get "spasy" when she tries to exercise.   Diagnostic tests MRI brain 03/12/15 that was normal per MR. VNG testing 03/07/15 that indicated central findings per MR.  Pt reports that she had the EEG test performed bu state they do not know the test results.    Patient Stated Goals Pt would like to be able to work, exercise and go shopping.       Neuromuscular Re-education: Patient's mother expressed concerns that she feels she did not receive a medical diagnosis from patient's physicians in regards to her condition and pt's mother expressed concern about if her dtr would have continued bouts of dizziness throughout her lifetime. Spent extensive time discussing POC with patient and her mother. In addition, encouraged pt and her mom to follow-up with her physician. Pt's mom reports they have a follow-up appt scheduled for January but they are going to try to see if the appt time can moved earlier.   VOR exercise: In standing on firm surface with feet together, pt performed VOR X 1 horiz 2 reps of 1 minute  each with conflicting background. Pt reports 5/10 dizziness with this activity.  Performed multiple trials of forward ambulation 35' while doing horiz VOR X 1 viewing with conflicting background. Pt reports 4/10 dizziness with walking VOR exercise.    Card Sorting Activity:  Pt performed deck of card sorting activity on mat table sorting by numbers and then turning to place cards on mat table behind her to incorporate vertical and horiz head turning and body turns. Pt reports mild dizziness 4/10 with this activity.    Ball Sorting Activity: On firm surface, performed transferring  multicolored balls from bin placed on mat to another bin placed 180 degrees on the other side of patient on the floor while patient visually tracks the ball so that patient is required to turn 180 degrees L and R to turn to bend over and pick and place balls in the bins with CGA. Pt with evidence of mild imbalance that patient was able to self-correct sometimes needing to reach out to briefly touch mat table and patient reports 4/5 dizziness symptoms with this activity. Pt required a few short sitting rest breaks less than one minute.          PT Education - 07/29/15 1443    Education provided Yes   Education Details Discussed HEP progression and POC. Spent extensive time discussing POC with patient and her mother.    Person(s) Educated Patient;Parent(s)   Methods Explanation;Demonstration   Comprehension Verbalized understanding             PT Long Term Goals - 07/22/15 1401    PT LONG TERM GOAL #1   Title Patient will reduce falls risk as indicated by Activities Specific Balance Confidence Scale (ABC) >67%.   Baseline 05/30/15: scored 18.1%; scored 40.6% 10/24, 07/22/15: 28.75%   Time 8   Period Weeks   Status On-going   PT LONG TERM GOAL #2   Title Patient will reduce perceived disability to moderate levels as indicated by <70 on Dizziness Handicap Inventory [facility](DHI).   Baseline 05/30/2015: 92 severe perception of handicap; scored 70 on 10/24, 07/22/15: 72/100   Time 8   Period Weeks   Status On-going   PT LONG TERM GOAL #3   Title Patient will be able to perform home program independently for self-management.   Time 8   Status Achieved   PT LONG TERM GOAL #4   Title Patient reports no vertigo with provoking motions or positions.   Baseline 07/22/15: 2x/wk   Time 8   Period Weeks   Status On-going   PT LONG TERM GOAL #5   Title Patient will demonstrate reduced falls risk as evidenced by Dynamic Gait Index (DGI) 22/24 or greater.   Baseline scored 22/24 on 10/24, 07/22/15:  22/24   Time 8   Period Weeks   Status Achieved               Plan - 07/29/15 1448    Clinical Impression Statement Pt reports that she had two days of persistent dizziness this past week but states after that her symptoms returned back down to pre-episode levels. Pt's mom expresses concerns in regards to her dtr's condition and encouraged pt's mom to follow up with patient's neurologist, Dr. Sherryll BurgerShah as well as spent time discussing PT plan of care and mother and pt are in agreement.     Pt will benefit from skilled therapeutic intervention in order to improve on the following deficits Decreased balance;Dizziness;Decreased coordination   Rehab Potential Fair  Clinical Impairments Affecting Rehab Potential Positive indicators; supportive mother, motivated  Negative indicators: potential central signs, chronicity, stressors   PT Frequency 1x / week   PT Duration 6 weeks   PT Treatment/Interventions Vestibular;Canalith Repostioning;Patient/family education;Neuromuscular re-education;Balance training   PT Next Visit Plan Review VOR with conflicting background. Consider trying ball sorting activity while standing on red foam mat and continuing to work on progressions of vestibular exercises.    PT Home Exercise Plan VOR x 1 horizontal with forward/retro ambulation, NBOS foam with horizontal head and body turns, seated diagonal saccades between 2 objects. Narrow BOS on foam with EC, mountain climbers with horiz head turns on foam (alternating marching while reaching upward with opposite UE), ambulaiton in hallway with horiz head and vert head turns.    Consulted and Agree with Plan of Care Patient;Family member/caregiver  mother        Problem List There are no active problems to display for this patient.  Mardelle Matte PT, DPT Murphy,Dorriea 07/29/2015, 2:55 PM  [place] [facility] MAIN [facility] SERVICES [address] [place], [place], [address] Phone:  [phone]   Fax:  [phone]  Name: Masen Salvas MRN: [id number] Date of Birth: [birth year]

## 2015-08-05 ENCOUNTER — Encounter: Payer: Self-pay | Admitting: Physical Therapy

## 2015-08-05 ENCOUNTER — Ambulatory Visit: Payer: BLUE CROSS/BLUE SHIELD | Admitting: Physical Therapy

## 2015-08-05 DIAGNOSIS — R42 Dizziness and giddiness: Secondary | ICD-10-CM | POA: Diagnosis not present

## 2015-08-05 DIAGNOSIS — R2689 Other abnormalities of gait and mobility: Secondary | ICD-10-CM

## 2015-08-05 NOTE — Therapy (Signed)
[place] [facility] [address] [place], [place], [address] Phone: [phone]   Fax:  [phone]  Physical Therapy Treatment  Patient Details  Name: Jacqueline ClinesSamantha Krueger MRN: [id number] Date of Birth: 07/06/1996 No Data Recorded  Encounter Date: 08/05/2015      PT End of Session - 08/05/15 1410    Visit Number 10   Number of Visits 14   Date for PT Re-Evaluation 09/05/15   PT Start Time 1301   PT Stop Time 1344   PT Time Calculation (min) 43 min   Equipment Utilized During Treatment Gait belt   Activity Tolerance Patient tolerated treatment well   Behavior During Therapy [facility] for tasks assessed/performed      Past Medical History  Diagnosis Date  . Seizures (HCC)     History reviewed. No pertinent past surgical history.  There were no vitals filed for this visit.  Visit Diagnosis:  Dizziness and giddiness  Imbalance      Subjective Assessment - 08/05/15 1309    Subjective Pt reports that she saw Dr. Sherryll BurgerShah last week. Pt states he reviewed her test results: halter monitor and EEG and all were negative. Pt states that Dr. Sherryll BurgerShah said that there is nothing else that he could do for her and he [facility] Pt is unsure what type of physician or the name. Pt reports she has not had any episodes of dizziness this past week.    Pertinent History Pt reports that she can be lying still and the dizziness comes on. Pt's mother reports that pt can get dizzy walking into stores and needs help to walk. Pt reports last seizure was when she was 89 or 19 years old. Pt was diagnosed with Grand mal seizures. Pt use to get headaches and use to take medication for migraines so this medication was stopped due to heart issues. Pt reports difficulties with bending, standing, walking and standing up. Pt reports her symptoms get worse as the day progresses. Pt reports that her legs get "spasy" when she tries to exercise.   Diagnostic tests MRI brain 03/12/15 that was normal per MR. VNG testing 03/07/15 that indicated central findings per MR.  Pt reports that she had the EEG test performed bu state they do not know the test results.    Patient Stated Goals Pt would like to be able to work, exercise and go shopping.         Neuromuscular Re-education:  VOR  Exercises: In standing on Airex pad, pt performed VOR X 1 horiz 3 reps of 1 minute each  Demonstrated VOR X 2 viewing.  In sitting, pt performed VOR X 2 horiz 3 reps of 1 minute each. Pt reported 3/10 dizziness.  Airex pad: Performed on Airex pad visual scanning of letters on mirror to spell words. Then on 1/2 foam roll with flat side and then rounded side down, performed reaching for letters on mirror to spell words. Pt with several small LOB that she was able to self-correct >90% of time with CGA.   Rockerboard: On medium wooden rocker board, worked on Home [employer]otSS and A/P sways with and without horiz and vert head turns. Pt able to perform with CGA and pt reached to touch for support briefly using a few fingers only 2-3 times during this activity.    Ball Sorting Activity: On red foam mat, performed transferring multicolored balls from bin placed on floor to another bin placed 180  degrees on the other side of patient while patient visually tracks the ball so that patient is required to turn 180 degrees L and R to turn to bend over and pick and place balls in the bins with CGA. Pt with evidence of mild imbalance that patient was able to self-correct and patient reports 3/10 symptoms with this activity. Pt did not require any rest breaks with this activity this date.          PT Long Term Goals - 07/22/15 1401    PT LONG TERM GOAL #1   Title Patient will reduce falls risk as indicated by Activities Specific Balance Confidence Scale (ABC) >67%.   Baseline 05/30/15: scored 18.1%; scored 40.6% 10/24, 07/22/15: 28.75%   Time 8   Period Weeks   Status On-going   PT  LONG TERM GOAL #2   Title Patient will reduce perceived disability to moderate levels as indicated by <70 on Dizziness Handicap Inventory [facility]).   Baseline 05/30/2015: 92 severe perception of handicap; scored 70 on 10/24, 07/22/15: 72/100   Time 8   Period Weeks   Status On-going   PT LONG TERM GOAL #3   Title Patient will be able to perform home program independently for self-management.   Time 8   Status Achieved   PT LONG TERM GOAL #4   Title Patient reports no vertigo with provoking motions or positions.   Baseline 07/22/15: 2x/wk   Time 8   Period Weeks   Status On-going   PT LONG TERM GOAL #5   Title Patient will demonstrate reduced falls risk as evidenced by Dynamic Gait Index (DGI) 22/24 or greater.   Baseline scored 22/24 on 10/24, 07/22/15: 22/24   Time 8   Period Weeks   Status Achieved               Plan - 08/05/15 1411    Clinical Impression Statement Pt had a follow-up appointment with Dr. Sherryll Burger last week and was given a referral for a physician at [facility] per pt's report. Pt demonstrated good improvement with balance activities this date and is reporting decreased symptoms with aggrevating activties. Pt reporting that she did not have any episodes of dizziness this past week and is doing better. Encouragaed pt to follow-up as scheduled.    Pt will benefit from skilled therapeutic intervention in order to improve on the following deficits Decreased balance;Dizziness;Decreased coordination   Rehab Potential Fair   Clinical Impairments Affecting Rehab Potential Positive indicators; supportive mother, motivated  Negative indicators: potential central signs, chronicity, stressors   PT Frequency 1x / week   PT Duration 6 weeks   PT Treatment/Interventions Vestibular;Canalith Repostioning;Patient/family education;Neuromuscular re-education;Balance training   PT Next Visit Plan Continue working on progressions of balance and vestibular exercises. review VOR X 2 and consider  progressing to diagonals, vert and on busy background.    PT Home Exercise Plan VOR x 1 horizontal with forward/retro ambulation, NBOS foam with horizontal head and body turns, seated diagonal saccades between 2 objects. Narrow BOS on foam with EC, mountain climbers with horiz head turns on foam (alternating marching while reaching upward with opposite UE), ambulaiton in hallway with horiz head and vert head turns.    Consulted and Agree with Plan of Care Patient        Problem List There are no active problems to display for this patient.  Mardelle Matte PT, DPT Duanna Runk 08/05/2015, 2:32 PM  [place] [facility] REHAB SERVICES [id number]  [address] [place], [place], [address] Phone: [phone]   Fax:  [phone]  Name: Camdyn Laden MRN: [id number] Date of Birth: [birth year]

## 2015-08-12 ENCOUNTER — Ambulatory Visit: Payer: BLUE CROSS/BLUE SHIELD

## 2015-08-12 DIAGNOSIS — R42 Dizziness and giddiness: Secondary | ICD-10-CM

## 2015-08-12 DIAGNOSIS — R2689 Other abnormalities of gait and mobility: Secondary | ICD-10-CM

## 2015-08-12 NOTE — Therapy (Signed)
[place] [facility] [address] [place], [place], [address] Phone: [phone]   Fax:  [phone]  Physical Therapy Treatment  Patient Details  Name: Ned ClinesSamantha Flaugher MRN: [id number] Date of Birth: 11/07/1995 No Data Recorded  Encounter Date: 08/12/2015      PT End of Session - 08/12/15 1310    Visit Number 11   Number of Visits 14   Date for PT Re-Evaluation 09/05/15   PT Start Time 1315   PT Stop Time 1400   PT Time Calculation (min) 45 min   Equipment Utilized During Treatment Gait belt   Activity Tolerance Patient tolerated treatment well   Behavior During Therapy [facility] for tasks assessed/performed      Past Medical History  Diagnosis Date  . Seizures (HCC)     History reviewed. No pertinent past surgical history.  There were no vitals filed for this visit.  Visit Diagnosis:  Dizziness and giddiness  Imbalance      Subjective Assessment - 08/12/15 1309    Subjective Pt states that she had one episode of dizziness since her last appointment. It occurred last night and started while she was playing a game on her ipad. Pt states that it lasted for couple hours until she finally went to sleep. Pt states that when she woke up the dizziness was improved but she still felt off-balance. [facility] but [facility] is [facility] couple months away. Pt reports she has no further knowledge of what type of physician she will  be seeing.    Pertinent History Pt reports that she can be lying still and the dizziness comes on. Pt's mother reports that pt can get dizzy walking into stores and needs help to walk. Pt reports last seizure was when she was 89 or 19 years old. Pt was diagnosed with Grand mal seizures. Pt use to get headaches and use to take medication for migraines so this medication was stopped due to heart issues. Pt reports difficulties with bending, standing, walking and standing up. Pt reports her symptoms get worse  as the day progresses. Pt reports that her legs get "spasy" when she tries to exercise.   Diagnostic tests MRI brain 03/12/15 that was normal per MR. VNG testing 03/07/15 that indicated central findings per MR.  Pt reports that she had the EEG test performed bu state they do not know the test results.    Patient Stated Goals Pt would like to be able to work, exercise and go shopping.    Currently in Pain? No/denies      Neuromuscular Re-education:  VOR Exercises: In standing on Airex pad, pt performed VOR X 1 horiz 2 reps of 1 minute each (0/10 dizziness); In standing on Airex pad, pt performed VOR X 1 horiz with conflicting background x 1 minute (3/10 dizziness);  VOR X 2 viewing.  In sitting, pt performed VOR X 2 horiz x 1 minute (0/10 dizziness); In sitting, pt performed VOR X 2 vertical x 1 minute (4/10 dizziness); In sitting, pt performed VOR X 2 horiz x 1 minute with conflicting background (0/10 dizziness); In sitting, pt performed VOR X 2 vertical x 1 minute with conflicting background (0/10 dizziness);  Ball Sorting Activity: On red foam mat, performed transferring multicolored balls from bin placed on floor to another bin placed 180 degrees on the other side of patient on the floor while patient visually tracks the ball so that patient is required to turn 180  degrees L and R to turn to bend over and pick and place balls in the bins with CGA. Pt with evidence of mild imbalance that patient was able to self-correct and patient reports 5/10 symptoms with this activity. Pt does require any rest breaks with this activity this date. Repeated activity on red mat with one bin on the floor and one above head so that pt has to stand on her toes to drop in the bin (4/10), pt with multiple episodes of LOB requiring 1 to 2 steps for correction.   Airex Balance Beam; Tandem gait with horizontal head turns x 4 lengths; Side stepping with horizontal head turns x 6 lengths; Tandem balance alternating  LE with visual scanning of letters on mirror to spell words. Added reaching for letters on mirror to spell words alternating UE to challenge BOS and cross midline;                            PT Education - 08/12/15 1309    Education provided Yes   Education Details Reviewed VOR x 2 exercise, reinforced HEP   Person(s) Educated Patient   Methods Explanation   Comprehension Verbalized understanding;Returned demonstration             PT Long Term Goals - 07/22/15 1401    PT LONG TERM GOAL #1   Title Patient will reduce falls risk as indicated by Activities Specific Balance Confidence Scale (ABC) >67%.   Baseline 05/30/15: scored 18.1%; scored 40.6% 10/24, 07/22/15: 28.75%   Time 8   Period Weeks   Status On-going   PT LONG TERM GOAL #2   Title Patient will reduce perceived disability to moderate levels as indicated by <70 on Dizziness Handicap Inventory [facility]).   Baseline 05/30/2015: 92 severe perception of handicap; scored 70 on 10/24, 07/22/15: 72/100   Time 8   Period Weeks   Status On-going   PT LONG TERM GOAL #3   Title Patient will be able to perform home program independently for self-management.   Time 8   Status Achieved   PT LONG TERM GOAL #4   Title Patient reports no vertigo with provoking motions or positions.   Baseline 07/22/15: 2x/wk   Time 8   Period Weeks   Status On-going   PT LONG TERM GOAL #5   Title Patient will demonstrate reduced falls risk as evidenced by Dynamic Gait Index (DGI) 22/24 or greater.   Baseline scored 22/24 on 10/24, 07/22/15: 22/24   Time 8   Period Weeks   Status Achieved               Plan - 08/12/15 1310    Clinical Impression Statement Pt continues to report intermittent dizziness at home as well as during session. Dizziness intensity does not always follow predictable pattern for example sometimes VOR x 2 vertical with conflicting background is less aggravating than without conflicting background.  Continued to provide progression of exercises today and pt seems to really have difficulty with balll passing between baskets on red mat to overhead reach. Pt provided progression of HEP and encouraged to continue additional exercises. Follow-up as scheduled.    Pt will benefit from skilled therapeutic intervention in order to improve on the following deficits Decreased balance;Dizziness;Decreased coordination   Rehab Potential Fair   Clinical Impairments Affecting Rehab Potential Positive indicators; supportive mother, motivated  Negative indicators: potential central signs, chronicity, stressors   PT Frequency 1x /  week   PT Duration 6 weeks   PT Treatment/Interventions Vestibular;Canalith Repostioning;Patient/family education;Neuromuscular re-education;Balance training   PT Next Visit Plan Continue working on progressions of balance and vestibular exercises. Continue VOR X 2 (horizontal and vertical) conflicting backgrounds and consider progressing to diagonals. continue ball passes on red mat   PT Home Exercise Plan VOR x 1 horizontal with forward/retro ambulation, NBOS foam with horizontal head and body turns, seated diagonal saccades between 2 objects. Narrow BOS on foam with EC, mountain climbers with horiz head turns on foam (alternating marching while reaching upward with opposite UE), ambulaiton in hallway with horiz head and vert head turns.  VOR x 2 horizontal and vertical;   Consulted and Agree with Plan of Care Patient        Problem List There are no active problems to display for this patient.  Lynnea Maizes PT, DPT   Marcie Shearon 08/12/2015, 3:12 PM  [place] [facility] MAIN [facility] SERVICES [address] [place], [place], [address] Phone: [phone]   Fax:  [phone]  Name: Martisha Toulouse MRN: [id number] Date of Birth: [birth year]

## 2015-08-21 ENCOUNTER — Ambulatory Visit: Payer: BLUE CROSS/BLUE SHIELD | Attending: Neurology | Admitting: Physical Therapy

## 2015-08-21 ENCOUNTER — Encounter: Payer: Self-pay | Admitting: Physical Therapy

## 2015-08-21 DIAGNOSIS — R42 Dizziness and giddiness: Secondary | ICD-10-CM | POA: Diagnosis present

## 2015-08-21 DIAGNOSIS — R2689 Other abnormalities of gait and mobility: Secondary | ICD-10-CM | POA: Insufficient documentation

## 2015-08-21 NOTE — Therapy (Signed)
[facility] [facility] [address] [place], [place], [address] Phone: [phone]   Fax:  [phone]  Physical Therapy Treatment  Patient Details  Name: Jacqueline ClinesSamantha Krueger MRN: [id number] Date of Birth: 10/05/1995 No Data Recorded  Encounter Date: 08/21/2015      PT End of Session - 08/21/15 1345    Visit Number 12   Number of Visits 14   Date for PT Re-Evaluation 09/05/15   PT Start Time 1300   PT Stop Time 1343   PT Time Calculation (min) 43 min   Equipment Utilized During Treatment Gait belt   Activity Tolerance Patient tolerated treatment well   Behavior During Therapy [facility] for tasks assessed/performed      Past Medical History  Diagnosis Date  . Seizures (HCC)     History reviewed. No pertinent past surgical history.  There were no vitals filed for this visit.  Visit Diagnosis:  Dizziness and giddiness  Imbalance      Subjective Assessment - 08/21/15 1255    Subjective Pt reports one episode of dizziness since the last therapy session. Pt has no recollection of what she was doing when the episode started but states that it lasted 3-4 hours and only resolved after she took a nap. HEP is going well without questions or concerns.    Pertinent History Pt reports that she can be lying still and the dizziness comes on. Pt's mother reports that pt can get dizzy walking into stores and needs help to walk. Pt reports last seizure was when she was 89 or 19 years old. Pt was diagnosed with Grand mal seizures. Pt use to get headaches and use to take medication for migraines so this medication was stopped due to heart issues. Pt reports difficulties with bending, standing, walking and standing up. Pt reports her symptoms get worse as the day progresses. Pt reports that her legs get "spasy" when she tries to exercise.   Diagnostic tests MRI brain 03/12/15 that was normal per MR. VNG testing 03/07/15 that indicated central findings per MR.   Pt reports that she had the EEG test performed bu state they do not know the test results.    Patient Stated Goals Pt would like to be able to work, exercise and go shopping.    Currently in Pain? No/denies   Multiple Pain Sites No        Neuromuscular Re-education:  VOR X 2 viewing.  In NBOS pt performed VOR X 2 horizontal 1 minute x 2 with "Connect 4 " board in front of conflicting background (4/10 dizziness); In NBOS pt performed VOR X 2 vertical 1 minute x 2 with "Connect 4 " board in front of conflicting background (4/10 dizziness);  Card Naming Pt bouncing vertically in sitting on physioball while therapist holds up playing cards at 5' for patient to name 60 seconds x 2; (5/10)  Remembered Target Seated remembered target head turns horizontal and vertical x 60 seconds each;  [employer] Retro ambulation with ball pass over shoulder with therapist varying height from waist to shoulder to overhead. 10685' x 2 alternating side; Forward and retro ambulation with ball dribble alternating from side to side 85' x 2; Forward and reto ambulatoin with vertical ball toss to self 6785' x 2;  Turns Ambulation in hallway with quick turns alternating directions 85' x 2, pt requiring 1-2 corrective steps intermittently and reports 5/10 dizziness. Body rolls near wall x 10 each direction with rest between repetitions (  5/10 dizziness);  Ball Sorting Activity: On red foam mat, performed transferring multicolored balls from bin placed on floor to another bin placed 180 degrees on the other side of patient on the floor while patient visually tracks the ball so that patient is required to turn 180 degrees L and R to turn to bend over and pick and place balls in the bins with CGA. Pt with evidence of mild imbalance that patient was able to self-correct and patient reports 5/10 symptoms with this activity. Pt does require any rest breaks with this activity this date. Repeated activity on red mat with one bin  on the floor and one above head so that pt has to stand on her toes to drop in the bin (4/10), pt with multiple episodes of LOB requiring 1 to 2 steps for correction.                                   PT Education - 08/21/15 1344    Education provided Yes   Education Details Reinforced and added to HEP   Person(s) Educated Patient   Methods Explanation;Demonstration;Tactile cues   Comprehension Verbalized understanding;Returned demonstration;Verbal cues required             PT Long Term Goals - 07/22/15 1401    PT LONG TERM GOAL #1   Title Patient will reduce falls risk as indicated by Activities Specific Balance Confidence Scale (ABC) >67%.   Baseline 05/30/15: scored 18.1%; scored 40.6% 10/24, 07/22/15: 28.75%   Time 8   Period Weeks   Status On-going   PT LONG TERM GOAL #2   Title Patient will reduce perceived disability to moderate levels as indicated by <70 on Dizziness Handicap Inventory [facility]).   Baseline 05/30/2015: 92 severe perception of handicap; scored 70 on 10/24, 07/22/15: 72/100   Time 8   Period Weeks   Status On-going   PT LONG TERM GOAL #3   Title Patient will be able to perform home program independently for self-management.   Time 8   Status Achieved   PT LONG TERM GOAL #4   Title Patient reports no vertigo with provoking motions or positions.   Baseline 07/22/15: 2x/wk   Time 8   Period Weeks   Status On-going   PT LONG TERM GOAL #5   Title Patient will demonstrate reduced falls risk as evidenced by Dynamic Gait Index (DGI) 22/24 or greater.   Baseline scored 22/24 on 10/24, 07/22/15: 22/24   Time 8   Period Weeks   Status Achieved               Plan - 08/21/15 1345    Clinical Impression Statement Pt still with intermittent symptoms (once/week) between therapy sessions. She demonstrates progress with therapist but still reports significant dizziness with VOR x 2 with conflicting background as well as corrective steps  during quick turns in hallway. Pt encouraged to continue HEP and add remembered targets to program. Follow-up as scheduled.    Pt will benefit from skilled therapeutic intervention in order to improve on the following deficits Decreased balance;Dizziness;Decreased coordination   Rehab Potential Fair   Clinical Impairments Affecting Rehab Potential Positive indicators; supportive mother, motivated  Negative indicators: potential central signs, chronicity, stressors   PT Frequency 1x / week   PT Duration 6 weeks   PT Treatment/Interventions Vestibular;Canalith Repostioning;Patient/family education;Neuromuscular re-education;Balance training   PT Next Visit Plan Continue VOR x  2 progression, work on unstable surfaces, quick turns in halls.    PT Home Exercise Plan VOR x 1 horizontal with forward/retro ambulation, NBOS foam with horizontal head and body turns, seated diagonal saccades between 2 objects. Narrow BOS on foam with EC, mountain climbers with horiz head turns on foam (alternating marching while reaching upward with opposite UE), ambulaiton in hallway with horiz head and vert head turns.  VOR x 2 horizontal and vertical, remebered targets   Consulted and Agree with Plan of Care Patient        Problem List There are no active problems to display for this patient.  Lynnea Maizes PT, DPT   Kamaury Cutbirth 08/21/2015, 1:48 PM  [place] [facility] MAIN [facility] SERVICES [address] [place], [place], [address] Phone: [phone]   Fax:  [phone]  Name: Jacqueline Krueger MRN: [id number] Date of Birth: August 30, 1996

## 2015-08-26 ENCOUNTER — Ambulatory Visit: Payer: BLUE CROSS/BLUE SHIELD | Admitting: Physical Therapy

## 2015-08-26 ENCOUNTER — Encounter: Payer: Self-pay | Admitting: Physical Therapy

## 2015-08-26 DIAGNOSIS — R42 Dizziness and giddiness: Secondary | ICD-10-CM | POA: Diagnosis not present

## 2015-08-26 DIAGNOSIS — R2689 Other abnormalities of gait and mobility: Secondary | ICD-10-CM

## 2015-08-26 NOTE — Therapy (Signed)
[place] [facility] MAIN [facility] SERVICES [address] [place], [place], [address] Phone: [phone]   Fax:  [phone]  Physical Therapy Treatment  Patient Details  Name: Jacqueline Krueger MRN: [id number] Date of Birth: Apr 02, 1996 No Data Recorded  Encounter Date: 08/26/2015      PT End of Session - 08/26/15 1349    Visit Number 13   Number of Visits 14   Date for PT Re-Evaluation 09/05/15   PT Start Time 1250   PT Stop Time 1330   PT Time Calculation (min) 40 min   Equipment Utilized During Treatment Gait belt   Activity Tolerance Patient tolerated treatment well   Behavior During Therapy [facility] for tasks assessed/performed      Past Medical History  Diagnosis Date  . Seizures (HCC)     History reviewed. No pertinent past surgical history.  There were no vitals filed for this visit.  Visit Diagnosis:  Dizziness and giddiness  Imbalance      Subjective Assessment - 08/26/15 1252    Subjective Pt reports she goes back to see Dr. Sherryll Burger in about 4 months. Pt reports Dr. Sherryll Burger had referred them to another neurologist and she has this appt in March. Pt is unsure who the neurologist is and what medical practice they are with. Pt reports she has been good and has been without dizziness episodes this past week. Pt reports she has been able to do shopping and exercie without trouble which were some of her goals with therapy. Pt reports she is going to start looking for a job in January and states she feels ready to try to work now.    Pertinent History Pt reports that she can be lying still and the dizziness comes on. Pt's mother reports that pt can get dizzy walking into stores and needs help to walk. Pt reports last seizure was when she was 71 or 19 years old. Pt was diagnosed with Grand mal seizures. Pt use to get headaches and use to take medication for migraines so this medication was stopped due to heart issues. Pt reports difficulties with bending,  standing, walking and standing up. Pt reports her symptoms get worse as the day progresses. Pt reports that her legs get "spasy" when she tries to exercise.   Diagnostic tests MRI brain 03/12/15 that was normal per MR. VNG testing 03/07/15 that indicated central findings per MR.  Pt reports that she had the EEG test performed bu state they do not know the test results.    Patient Stated Goals Pt would like to be able to work, exercise and go shopping.      Neuromuscular Re-education: VOR X2:  In standing on foam surface, pt performed VOR X 2 horiz 2 reps and vertical 2 reps of 1 minute each with conflicting background. Pt reports 3/10 dizziness with this activity. Pt demonstrates good technique with this exercise.   Vestibulo-ocular exercise: Pt sat on physioball and did gentle bouncing up and down and side to side while therapist holds up playing cards at 5' for patient to name times 2-3 minutes. Pt reports no dizziness this date with this activity.  foam roll: On 1/2 foam roll with flat side down performed balloon toss to self for 3-5 minutes each. Repeated balloon self-toss activity on Airex pad. Pt with multiple small losses of balance with this activity greater on 1/2 foam roll many patient was able to self-correct but she did ned to reach out to the // bar  for support at times but this was usually when she reached outside her base of support.   Quick Turns:  Performed multiple reps walking an obstacle course with 1/4 quick turns L/R and full turn-arounds L/R. Pt with mild imbalance with this activity but denies dizziness.     Pt performed basketball dribbling to targets where pt had to make either a 1/4 turn or full turn around while tracking ball with eyes and head.   Newman PiesBall toss over shoulder:  Pt performed multiple 9075' trials of forward ambulation while tossing ball over one shoulder with return catch over opposite shoulder varying the ball position to head, shoulder and waist level to  promote head turning and tilting.   Hallway ball toss: In hallway, worked on ball toss against one wall with alternating quick turns to toss ball against opposite wall. 5/10 dizziness reported with this activity.        PT Education - 08/26/15 1348    Education provided Yes   Education Details Reinforced HEP   Person(s) Educated Patient   Methods Explanation   Comprehension Verbalized understanding             PT Long Term Goals - 07/22/15 1401    PT LONG TERM GOAL #1   Title Patient will reduce falls risk as indicated by Activities Specific Balance Confidence Scale (ABC) >67%.   Baseline 05/30/15: scored 18.1%; scored 40.6% 10/24, 07/22/15: 28.75%   Time 8   Period Weeks   Status On-going   PT LONG TERM GOAL #2   Title Patient will reduce perceived disability to moderate levels as indicated by <70 on Dizziness Handicap Inventory [facility]).   Baseline 05/30/2015: 92 severe perception of handicap; scored 70 on 10/24, 07/22/15: 72/100   Time 8   Period Weeks   Status On-going   PT LONG TERM GOAL #3   Title Patient will be able to perform home program independently for self-management.   Time 8   Status Achieved   PT LONG TERM GOAL #4   Title Patient reports no vertigo with provoking motions or positions.   Baseline 07/22/15: 2x/wk   Time 8   Period Weeks   Status On-going   PT LONG TERM GOAL #5   Title Patient will demonstrate reduced falls risk as evidenced by Dynamic Gait Index (DGI) 22/24 or greater.   Baseline scored 22/24 on 10/24, 07/22/15: 22/24   Time 8   Period Weeks   Status Achieved               Plan - 08/26/15 1349    Clinical Impression Statement Pt did well this visit and demonstrates good technique with VOR X 2 exercise. Pt reporting overall improvements in her symptoms and with progress towards her goals. Pt does continue to have difficulty with balance on unstable surfaces and quick body turns causing mild dizziness and imbalance that she is able  to self-correct. Pt reporting significant decrease in her symptoms and overall improvement. Will plan on retesting functional outcome measures next visit.    Pt will benefit from skilled therapeutic intervention in order to improve on the following deficits Decreased balance;Dizziness;Decreased coordination   Rehab Potential Fair   Clinical Impairments Affecting Rehab Potential Positive indicators; supportive mother, motivated  Negative indicators: potential central signs, chronicity, stressors   PT Frequency 1x / week   PT Duration 6 weeks   PT Treatment/Interventions Vestibular;Canalith Repostioning;Patient/family education;Neuromuscular re-education;Balance training   PT Next Visit Plan Retest functional outcome measures  PT Home Exercise Plan VOR x 1 horizontal with forward/retro ambulation, NBOS foam with horizontal head and body turns, seated diagonal saccades between 2 objects. Narrow BOS on foam with EC, mountain climbers with horiz head turns on foam (alternating marching while reaching upward with opposite UE), ambulaiton in hallway with horiz head and vert head turns.  VOR x 2 horizontal and vertical, remebered targets   Consulted and Agree with Plan of Care Patient        Problem List There are no active problems to display for this patient.  Mardelle Matte PT, DPT Arlie Posch 08/26/2015, 1:53 PM  [place] [facility] MAIN [facility] SERVICES [address] [place], [place], [address] Phone: [phone]   Fax:  [phone]  Name: Jacqueline Krueger MRN: [id number] Date of Birth: [birth year]

## 2015-09-02 ENCOUNTER — Ambulatory Visit: Payer: BLUE CROSS/BLUE SHIELD | Admitting: Physical Therapy

## 2015-09-02 ENCOUNTER — Encounter: Payer: Self-pay | Admitting: Physical Therapy

## 2015-09-02 DIAGNOSIS — R42 Dizziness and giddiness: Secondary | ICD-10-CM

## 2015-09-02 DIAGNOSIS — R2689 Other abnormalities of gait and mobility: Secondary | ICD-10-CM

## 2015-09-02 NOTE — Therapy (Signed)
[place] MAIN [facility] SERVICES [address] [place], [place], [address] Phone: [phone]   Fax:  [phone]  Physical Therapy Treatment/Discharge  Patient Details  Name: Jacqueline Krueger MRN: [id number] Date of Birth: [birth year] No Data Recorded  Encounter Date: 09/02/2015      PT End of Session - 09/02/15 1322    Visit Number 14   Number of Visits 14   Date for PT Re-Evaluation 09/05/15   PT Start Time 1315   PT Stop Time 1350   PT Time Calculation (min) 35 min   Equipment Utilized During Treatment Gait belt   Activity Tolerance Patient tolerated treatment well   Behavior During Therapy [facility] for tasks assessed/performed      Past Medical History  Diagnosis Date  . Seizures ([place])     History reviewed. No pertinent past surgical history.  There were no vitals filed for this visit.  Visit Diagnosis:  Dizziness and giddiness  Imbalance      Subjective Assessment - 09/02/15 1322    Subjective Pt states that she did well this past week. Pt states she did have one episode of dizziness last week on Monday came on in the afternoon that lasted into the evening. Pt had come to therapy in the am. Pt reports she has been doing her home exercise program consistently at home. Pt reports that the quick turns still bring on the dizziness but that this has improved overall since starting therapy and since starting the quick turn exercises.    Pertinent History Pt reports that she can be lying still and the dizziness comes on. Pt's mother reports that pt can get dizzy walking into stores and needs help to walk. Pt reports last seizure was when she was 19 or 19 years old. Pt was diagnosed with Grand mal seizures. Pt use to get headaches and use to take medication for migraines so this medication was stopped due to heart issues. Pt reports difficulties with bending, standing, walking and standing up. Pt reports her symptoms get worse as the day  progresses. Pt reports that her legs get "spasy" when she tries to exercise.   Diagnostic tests MRI brain 03/12/15 that was normal per MR. VNG testing 03/07/15 that indicated central findings per MR.  Pt reports that she had the EEG test performed bu state they do not know the test results.    Patient Stated Goals Pt would like to be able to work, exercise and go shopping.       Neuromuscular Re-education:  Pt performed the ABC scale, DHI and DGI functional outcome measures. Reviewed with patient test results and compared to prior results.  Discussed progress towards therapy and patient goals.  POSTURAL CONTROL TESTS:   Clinical Test of Sensory Interaction for Balance    (CTSIB):  CONDITION TIME STRATEGY SWAY  Eyes open, firm surface 30 sec ankle   Eyes closed, firm surface 30 sec ankle +1  Eyes open, foam surface 30 sec ankle +1  Eyes closed, foam surface 30 sec ankle +1          PT Long Term Goals - 09/02/15 1401    PT LONG TERM GOAL #1   Title Patient will reduce falls risk as indicated by Activities Specific Balance Confidence Scale (ABC) >67%.   Baseline 05/30/15: scored 18.1%; scored 40.6% 10/24, 07/22/15: 28.75%   Time 8   Period Weeks   Status Partially Met   PT LONG TERM GOAL #2   Title Patient  will reduce perceived disability to moderate levels as indicated by <70 on Dizziness Handicap Inventory [facility]).   Baseline 05/30/2015: 92 severe perception of handicap; scored 70 on 10/24, 07/22/15: 72/100   Time 8   Period Weeks   Status Achieved   PT LONG TERM GOAL #3   Title Patient will be able to perform home program independently for self-management.   Time 8   Period Weeks   Status Achieved   PT LONG TERM GOAL #4   Title Patient reports no vertigo with provoking motions or positions.   Baseline 07/22/15: 2x/wk   Time 8   Period Weeks   Status Partially Met   PT LONG TERM GOAL #5   Title Patient will demonstrate reduced falls risk as evidenced by Dynamic Gait Index  (DGI) 22/24 or greater.   Baseline scored 22/24 on 10/24, 07/22/15: 22/24   Time 8   Period Weeks   Status Achieved               Plan - 09/02/15 1323    Clinical Impression Statement Pt reports only one episode of dizziness in over 3 weeks time. Pt reports that she feels she has improved and has been able to resume her prior activities. Pt  states she plans on trying to seek employment in January and states she feels ready to work. Pt has met majority of goals as set on POC and has partially met remaining goals. Pt improved from an initial score of 92 with severe perception of handicap on the [facility] to a 54 which is moderate perception of handicap. Pt has improved from an initia score of 18% on the ABC scale to a 66.8%. Pt improved from a 21/24 to a 24/24 on the DGI. Pt is reporting significant improvement in her dizziness symptoms and balance. Pt is in agreement wiht discharge from PT services at this time.     Pt will benefit from skilled therapeutic intervention in order to improve on the following deficits Decreased balance;Dizziness;Decreased coordination   Rehab Potential Fair   Clinical Impairments Affecting Rehab Potential Positive indicators; supportive mother, motivated  Negative indicators: potential central signs, chronicity, stressors   PT Frequency 1x / week   PT Duration 6 weeks   PT Treatment/Interventions Vestibular;Canalith Repostioning;Patient/family education;Neuromuscular re-education;Balance training   PT Home Exercise Plan VOR x 1 horizontal with forward/retro ambulation, NBOS foam with horizontal head and body turns, seated diagonal saccades between 2 objects. Narrow BOS on foam with EC, mountain climbers with horiz head turns on foam (alternating marching while reaching upward with opposite UE), ambulaiton in hallway with horiz head and vert head turns.  VOR x 2 horizontal and vertical, remebered targets   Consulted and Agree with Plan of Care Patient         Problem List There are no active problems to display for this patient.  Lady Deutscher PT, DPT Lady Deutscher 09/03/2015, 8:18 AM  [place] MAIN [facility] SERVICES [address] [place], [place], [address] Phone: [phone]   Fax:  [phone]  Name: Arnesha Schiraldi MRN: [id number] Date of Birth: 05/11/1996

## 2017-01-08 ENCOUNTER — Encounter: Payer: Self-pay | Admitting: *Deleted

## 2017-01-08 ENCOUNTER — Ambulatory Visit
Admission: EM | Admit: 2017-01-08 | Discharge: 2017-01-08 | Disposition: A | Discharge status: Home / Self Care | Payer: BLUE CROSS/BLUE SHIELD | Attending: Family Medicine | Admitting: Family Medicine

## 2017-01-08 DIAGNOSIS — J029 Acute pharyngitis, unspecified: Secondary | ICD-10-CM

## 2017-01-08 DIAGNOSIS — H6121 Impacted cerumen, right ear: Secondary | ICD-10-CM

## 2017-01-08 DIAGNOSIS — J028 Acute pharyngitis due to other specified organisms: Secondary | ICD-10-CM

## 2017-01-08 DIAGNOSIS — R42 Dizziness and giddiness: Secondary | ICD-10-CM | POA: Diagnosis not present

## 2017-01-08 DIAGNOSIS — W57XXXA Bitten or stung by nonvenomous insect and other nonvenomous arthropods, initial encounter: Secondary | ICD-10-CM

## 2017-01-08 DIAGNOSIS — S40861A Insect bite (nonvenomous) of right upper arm, initial encounter: Secondary | ICD-10-CM | POA: Diagnosis not present

## 2017-01-08 DIAGNOSIS — B9789 Other viral agents as the cause of diseases classified elsewhere: Secondary | ICD-10-CM | POA: Diagnosis not present

## 2017-01-08 LAB — RAPID STREP SCREEN (MED CTR MEBANE ONLY): Streptococcus, Group A Screen (Direct): NEGATIVE

## 2017-01-08 MED ORDER — MUPIROCIN 2 % EX OINT: 1.0000 "application " | TOPICAL_OINTMENT | Freq: Three times a day (TID) | CUTANEOUS | 0 refills | Status: DC

## 2017-01-08 MED ORDER — ATENOLOL 25 MG PO TABS: 25.0000 mg | ORAL_TABLET | Freq: Every day | ORAL | 0 refills | Status: DC

## 2017-01-08 NOTE — ED Triage Notes (Signed)
Patient removed tick from her right medial upper arm 2 weeks ago. Evidence of the bite mark still present but appears to be healing well. Symptom of of itchy throat and cough 2 days ago. Patient does have history of allergies. Patient has history of headache and dizziness with my recent episode starting 1 week ago.

## 2017-01-08 NOTE — ED Provider Notes (Signed)
CSN: [id number]     Arrival date & time 01/08/17  1050 History   First MD Initiated Contact with Patient 01/08/17 1132     Chief Complaint  Patient presents with  . Insect Bite  . Sore Throat  . Dizziness   (Consider location/radiation/quality/duration/timing/severity/associated sxs/prior Treatment) HPI  This a 21 year old female who is accompanied by her mother presenting with a sore throat that is itchy. She has had some coughing from the itchy feeling. She also is complaining of dizzy spells that she has had in the past worked up which were thought to be migraines. She is in the process of finding a new primary care physician and has run out of her atenolol medication that was started for her. She states that this did help her when she was using it. They have an appointment next month with a new primary care. She also has recently pulled a tick from her upper medial arm on the right. She has surrounding erythema. Is no discharge. There is no ascending lymphangitis present. She states that the tick was small and not engorged according to the mother. She's had no fever and no rashes.      Past Medical History:  Diagnosis Date  . Seizures (HCC)    Past Surgical History:  Procedure Laterality Date  . EYE SURGERY     Family History  Problem Relation Age of Onset  . Hypertension Father    Social History  Substance Use Topics  . Smoking status: Never Smoker  . Smokeless tobacco: Never Used  . Alcohol use No   OB History    No data available     Review of Systems  Constitutional: Positive for activity change. Negative for chills, fatigue and fever.  HENT: Positive for congestion, postnasal drip and sore throat.   Respiratory: Positive for cough.   Skin: Positive for wound.  All other systems reviewed and are negative.   Allergies  Patient has no known allergies.  Home Medications   Prior to Admission medications   Medication Sig Start Date End Date Taking? Authorizing  Provider  atenolol (TENORMIN) 25 MG tablet Take 1 tablet (25 mg total) by mouth daily. 01/08/17   Lutricia Feil, PA-C  mupirocin ointment (BACTROBAN) 2 % Apply 1 application topically 3 (three) times daily. 01/08/17   Lutricia Feil, PA-C  scopolamine (TRANSDERM-SCOP) 1 MG/3DAYS Place 1 patch onto the skin every 3 (three) days.    Historical Provider, MD   Meds Ordered and Administered this Visit  Medications - No data to display  BP 104/62 (BP Location: Left Arm)   Pulse 92   Temp 98 F (36.7 C) (Oral)   Ht  (1.6 m)   Wt 150 lb (68 kg)   LMP 01/04/2017   SpO2 100%   BMI 26.57 kg/m  No data found.   Physical Exam  Constitutional: She is oriented to person, place, and time. She appears well-developed and well-nourished. No distress.  HENT:  Head: Normocephalic and atraumatic.  Nose: Nose normal.  Right ear canal has a burden of cerumen occluding it. Left ear has a smaller amount.  Eyes: EOM are normal. Pupils are equal, round, and reactive to light. Right eye exhibits no discharge. Left eye exhibits no discharge.  Neck: Normal range of motion.  Pulmonary/Chest: Effort normal and breath sounds normal. No respiratory distress. She has no wheezes. She has no rales.  Musculoskeletal: Normal range of motion.  Lymphadenopathy:    She has no cervical  adenopathy.  Neurological: She is alert and oriented to person, place, and time.  Skin: Skin is warm and dry. She is not diaphoretic.  Patient has a area of erythema under her right upper arm measuring approximately 1 cm in diameter with a central punctate bite mark. There is no induration of fluctuance and no ascending lymphangitis.  Psychiatric: She has a normal mood and affect. Her behavior is normal. Judgment and thought content normal.  Nursing note and vitals reviewed.   Urgent Care Course     Procedures (including critical care time)  Labs Review Labs Reviewed  RAPID STREP SCREEN (NOT AT [facility])  CULTURE, GROUP A  STREP [facility])    Imaging Review No results found.   Visual Acuity Review  Right Eye Distance:   Left Eye Distance:   Bilateral Distance:    Right Eye Near:   Left Eye Near:    Bilateral Near:     Patient had bilateral ear lavage     MDM   1. Dizziness   2. Tick bite, initial encounter   3. Acute viral pharyngitis    Discharge Medication List as of 01/08/2017 12:31 PM    START taking these medications   Details  atenolol (TENORMIN) 25 MG tablet Take 1 tablet (25 mg total) by mouth daily., Starting Fri 01/08/2017, Normal    mupirocin ointment (BACTROBAN) 2 % Apply 1 application topically 3 (three) times daily., Starting Fri 01/08/2017, Normal      Plan: 1. Test/x-ray results and diagnosis reviewed with patient 2. rx as per orders; risks, benefits, potential side effects reviewed with patient 3. Recommend supportive treatment with Increasing fluids to keep her urine clear to yellow. Follow-up with a primary care physician when established. Sore throat is likely a viral illness and possibly seasonal allergies.  She should consider using Flonase daily along with Zyrtec or Claritin. She will do salt water gargles for comfort.  4. F/u prn if symptoms worsen or don't improve     Lutricia Feil, PA-C 01/08/17 1236

## 2017-01-10 LAB — CULTURE, GROUP A STREP (THRC)

## 2017-01-11 ENCOUNTER — Telehealth: Payer: Self-pay | Admitting: *Deleted

## 2017-01-11 MED ORDER — AMOXICILLIN 500 MG PO CAPS: 500.0000 mg | ORAL_CAPSULE | Freq: Two times a day (BID) | ORAL | 0 refills | Status: AC

## 2017-01-11 NOTE — Telephone Encounter (Signed)
Patient called and informed that prescription is available at pharmacy on record.

## 2018-05-18 ENCOUNTER — Ambulatory Visit
Admission: EM | Admit: 2018-05-18 | Discharge: 2018-05-18 | Disposition: A | Discharge status: Home / Self Care | Payer: BLUE CROSS/BLUE SHIELD | Attending: Family Medicine | Admitting: Family Medicine

## 2018-05-18 ENCOUNTER — Other Ambulatory Visit: Payer: Self-pay

## 2018-05-18 DIAGNOSIS — J069 Acute upper respiratory infection, unspecified: Secondary | ICD-10-CM | POA: Diagnosis not present

## 2018-05-18 DIAGNOSIS — J029 Acute pharyngitis, unspecified: Secondary | ICD-10-CM

## 2018-05-18 LAB — RAPID STREP SCREEN (MED CTR MEBANE ONLY): STREPTOCOCCUS, GROUP A SCREEN (DIRECT): NEGATIVE

## 2018-05-18 NOTE — ED Triage Notes (Signed)
Patient complains of sore throat and painful swallowing that started yesterday. Patient reports that boyfriend is also sick.

## 2018-05-18 NOTE — ED Provider Notes (Signed)
[facility] ____________________________________________  Time seen: Approximately 10:48 AM  I have reviewed the triage vital signs and the nursing notes.   HISTORY  Chief Complaint Sore Throat  HPI Jacqueline Krueger is a 22 y.o. female present with family at bedside for evaluation of runny nose, nasal congestion, sore throat and nasal drainage since yesterday.  States no cough, no fevers.  Reports has overall continued to eat and drink well.  No alleviating measures attempted.  Sore throat is moderate, worse with swallowing.  Has not taken any over-the-counter medication for the same complaints.  States that her boyfriend is also sick with similar complaints just prior to her sickness onset.  Reports otherwise feels well.  Denies aggravating or alleviating factors.  Denies recent sickness. Denies recent antibiotic use.   Dione Housekeeper, MD: PCP Patient's last menstrual period was 04/25/2018.Denies pregnancy.    Past Medical History:  Diagnosis Date  . Seizures (HCC)     There are no active problems to display for this patient.   Past Surgical History:  Procedure Laterality Date  . EYE SURGERY       No current facility-administered medications for this encounter.   Current Outpatient Medications:  .  levonorgestrel-ethinyl estradiol (AVIANE,ALESSE,LESSINA) 0.1-20 MG-MCG tablet, Take by mouth., Disp: , Rfl:   Allergies Patient has no known allergies.  Family History  Problem Relation Age of Onset  . Hypertension Father     Social History Social History   Tobacco Use  . Smoking status: Never Smoker  . Smokeless tobacco: Never Used  Substance Use Topics  . Alcohol use: Yes    Comment: occasionally  . Drug use: No    Review of Systems Constitutional: No fever/chills ENT: as above Cardiovascular: Denies chest pain. Respiratory: Denies shortness of breath. Gastrointestinal: No abdominal pain.  Musculoskeletal: Negative for back pain. Skin:  Negative for rash.   ____________________________________________   PHYSICAL EXAM:  VITAL SIGNS: ED Triage Vitals  Enc Vitals Group     BP 05/18/18 1022 111/72     Pulse Rate 05/18/18 1022 75     Resp 05/18/18 1022 18     Temp 05/18/18 1022 98.7 F (37.1 C)     Temp Source 05/18/18 1022 Oral     SpO2 05/18/18 1022 100 %     Weight 05/18/18 1020 160 lb (72.6 kg)     Height 05/18/18 1020 5\' 3"  (1.6 m)     Head Circumference --      Peak Flow --      Pain Score 05/18/18 1020 5     Pain Loc --      Pain Edu? --      Excl. in [place]? --    Constitutional: Alert and oriented. Well appearing and in no acute distress. Eyes: Conjunctivae are normal.  Head: Atraumatic. No sinus tenderness to palpation. No swelling. No erythema.  Ears: no erythema, normal TMs bilaterally.   Nose:Nasal congestion with clear rhinorrhea  Mouth/Throat: Mucous membranes are moist. Mild pharyngeal erythema. No tonsillar swelling or exudate.  Neck: No stridor.  No cervical spine tenderness to palpation. Hematological/Lymphatic/Immunilogical: No cervical lymphadenopathy. Cardiovascular: Normal rate, regular rhythm. Grossly normal heart sounds.  Good peripheral circulation. Respiratory: Normal respiratory effort.  No retractions. No wheezes, rales or rhonchi. Good air movement.  Musculoskeletal: Ambulatory with steady gait.  Neurologic:  Normal speech and language. No gait instability. Skin:  Skin appears warm, dry and intact. No rash noted. Psychiatric: Mood and affect are normal. Speech and behavior  are normal.   ___________________________________________   LABS (all labs ordered are listed, but only abnormal results are displayed)  Labs Reviewed  RAPID STREP SCREEN ([facility] ONLY)  CULTURE, GROUP A STREP [facility])   ____________________________________________ PROCEDURES Procedures     INITIAL IMPRESSION / ASSESSMENT AND PLAN / ED COURSE  Pertinent labs & imaging results that were available  during my care of the patient were reviewed by me and considered in my medical decision making (see chart for details).  Well-appearing patient.  No acute distress.  Quick strep negative, will culture.  Suspect viral upper respiratory infection.  Encourage rest, fluids, supportive care, over-the-counter cough and congestion medications as needed.  Work note given for today and tomorrow.  Discussed follow up with Primary care physician this week as needed. Discussed follow up and return parameters including no resolution or any worsening concerns. Patient verbalized understanding and agreed to plan.   ____________________________________________   FINAL CLINICAL IMPRESSION(S) / ED DIAGNOSES  Final diagnoses:  Upper respiratory tract infection, unspecified type  Pharyngitis, unspecified etiology     ED Discharge Orders    None       Note: This dictation was prepared with Dragon dictation along with smaller phrase technology. Any transcriptional errors that result from this process are unintentional.         Renford Dills, NP 05/18/18 1150

## 2018-05-18 NOTE — Discharge Instructions (Signed)
Over-the-counter cough and congestion medication as needed.  Rest. Drink plenty of fluids.  ° °Follow up with your primary care physician this week as needed. Return to Urgent care for new or worsening concerns.  ° °

## 2018-05-21 LAB — CULTURE, GROUP A STREP (THRC)

## 2018-11-19 ENCOUNTER — Other Ambulatory Visit: Payer: Self-pay

## 2018-11-19 ENCOUNTER — Emergency Department
Admission: EM | Admit: 2018-11-19 | Discharge: 2018-11-19 | Disposition: A | Discharge status: Home / Self Care | Payer: BLUE CROSS/BLUE SHIELD | Attending: Emergency Medicine | Admitting: Emergency Medicine

## 2018-11-19 ENCOUNTER — Emergency Department: Payer: BLUE CROSS/BLUE SHIELD

## 2018-11-19 DIAGNOSIS — R0789 Other chest pain: Secondary | ICD-10-CM | POA: Diagnosis not present

## 2018-11-19 DIAGNOSIS — R0602 Shortness of breath: Secondary | ICD-10-CM | POA: Diagnosis present

## 2018-11-19 LAB — FIBRIN DERIVATIVES D-DIMER (ARMC ONLY): Fibrin derivatives D-dimer (ARMC): 916.43 ng/mL (FEU) — ABNORMAL HIGH (ref 0.00–499.00)

## 2018-11-19 LAB — CBC WITH DIFFERENTIAL/PLATELET
Abs Immature Granulocytes: 0.02 10*3/uL (ref 0.00–0.07)
Basophils Absolute: 0.1 10*3/uL (ref 0.0–0.1)
Basophils Relative: 1 %
Eosinophils Absolute: 0.2 10*3/uL (ref 0.0–0.5)
Eosinophils Relative: 2 %
HCT: 45.2 % (ref 36.0–46.0)
Hemoglobin: 14.8 g/dL (ref 12.0–15.0)
Immature Granulocytes: 0 %
Lymphocytes Relative: 32 %
Lymphs Abs: 2.5 10*3/uL (ref 0.7–4.0)
MCH: 30 pg (ref 26.0–34.0)
MCHC: 32.7 g/dL (ref 30.0–36.0)
MCV: 91.5 fL (ref 80.0–100.0)
MONO ABS: 0.4 10*3/uL (ref 0.1–1.0)
Monocytes Relative: 5 %
Neutro Abs: 4.7 10*3/uL (ref 1.7–7.7)
Neutrophils Relative %: 60 %
Platelets: 350 10*3/uL (ref 150–400)
RBC: 4.94 MIL/uL (ref 3.87–5.11)
RDW: 12.2 % (ref 11.5–15.5)
WBC: 7.9 10*3/uL (ref 4.0–10.5)
nRBC: 0 % (ref 0.0–0.2)

## 2018-11-19 LAB — COMPREHENSIVE METABOLIC PANEL
ALBUMIN: 4.2 g/dL (ref 3.5–5.0)
ALK PHOS: 31 U/L — AB (ref 38–126)
ALT: 16 U/L (ref 0–44)
AST: 17 U/L (ref 15–41)
Anion gap: 9 (ref 5–15)
BUN: 14 mg/dL (ref 6–20)
CALCIUM: 9 mg/dL (ref 8.9–10.3)
CO2: 25 mmol/L (ref 22–32)
CREATININE: 0.69 mg/dL (ref 0.44–1.00)
Chloride: 105 mmol/L (ref 98–111)
GFR calc non Af Amer: >60 mL/min (ref >60)
GLUCOSE: 101 mg/dL — AB (ref 70–99)
Potassium: 3.9 mmol/L (ref 3.5–5.1)
SODIUM: 139 mmol/L (ref 135–145)
Total Bilirubin: 0.5 mg/dL (ref 0.3–1.2)
Total Protein: 7.6 g/dL (ref 6.5–8.1)

## 2018-11-19 LAB — POCT PREGNANCY, URINE: Preg Test, Ur: NEGATIVE

## 2018-11-19 LAB — TROPONIN I: Troponin I: <0.03 ng/mL (ref <0.03)

## 2018-11-19 MED ORDER — IOHEXOL 350 MG/ML SOLN
75.0000 mL | Freq: Once | INTRAVENOUS | Status: AC | PRN
  Administered 2018-11-19: 75 mL via INTRAVENOUS

## 2018-11-19 NOTE — ED Triage Notes (Signed)
Pt states was on her way to work this morning when she began to have chest pain, nausea, " I started crying and I don't know what happened". Pt states she was very anxious. Pt appears anxious during triage. Pt denies pain currently, denies nausea currently.

## 2018-11-19 NOTE — ED Provider Notes (Signed)
[facility] Emergency Department Provider Note   ____________________________________________   First MD Initiated Contact with Patient 11/19/18 [phone]     (approximate)  I have reviewed the triage vital signs and the nursing notes.   HISTORY  Chief Complaint Panic Attack    HPI Jacqueline Krueger is a 23 y.o. female reports history of depression, anxiety  Reports that today she was driving her car with her mother when she suddenly felt like she had a feeling of anxiety followed by feeling very short of breath.  This improved now.  Her mom drove her to the ER.  She reports it felt strange it was like a weird pressure or discomfort in her chest but is since gone away.  It occurred while riding the car.  She reports she is had several episodes where she gets very anxious and feels somewhat similar over the last several months, mom reports "years" but they seem to be getting a little worse.  She saw physician and started Effexor yesterday as well.  No nausea vomiting.  No fevers or chills.  No chest pain, but rather reports it was a sensation is hard to describe along with feeling of being very short of breath and very anxious which is since relieved.  There is no discomfort that radiated.  There is no sharp or pleuritic type pain.  Denies leg swelling.  No history of blood clots.  No recent travel.  No recent surgeries.  Denies pregnancy.   Past Medical History:  Diagnosis Date  . Seizures (HCC)     There are no active problems to display for this patient.   Past Surgical History:  Procedure Laterality Date  . EYE SURGERY      Prior to Admission medications   Medication Sig Start Date End Date Taking? Authorizing Provider  levonorgestrel-ethinyl estradiol (AVIANE,ALESSE,LESSINA) 0.1-20 MG-MCG tablet Take by mouth. 03/10/18 03/10/19 Yes [provider]  venlafaxine (EFFEXOR) 37.5 MG tablet Take 37.5 mg by mouth daily. 11/16/18 03/16/19 Yes [provider]    Allergies Patient has no known allergies.  Family History  Problem Relation Age of Onset  . Hypertension Father     Social History Social History   Tobacco Use  . Smoking status: Never Smoker  . Smokeless tobacco: Never Used  Substance Use Topics  . Alcohol use: Yes    Comment: occasionally  . Drug use: No    Review of Systems Constitutional: No fever/chills Eyes: No visual changes. ENT: No sore throat. Cardiovascular: Denies chest pain. Respiratory: Denies shortness of breath. Gastrointestinal: No abdominal pain.   Genitourinary: Negative for dysuria. Musculoskeletal: Negative for back pain. Skin: Negative for rash. Neurological: Negative for headaches, areas of focal weakness or numbness.    ____________________________________________   PHYSICAL EXAM:  VITAL SIGNS: ED Triage Vitals  Enc Vitals Group     BP 11/19/18 0603 118/73     Pulse Rate 11/19/18 0603 100     Resp 11/19/18 0603 16     Temp 11/19/18 0603 98.4 F (36.9 C)     Temp Source 11/19/18 0603 Oral     SpO2 11/19/18 0603 100 %     Weight 11/19/18 0604 175 lb (79.4 kg)     Height 11/19/18 0604 5\' 3"  (1.6 m)     Head Circumference --      Peak Flow --      Pain Score 11/19/18 0603 0     Pain Loc --  Pain Edu? --      Excl. in GC? --     Constitutional: Alert and oriented. Well appearing and in no acute distress. Eyes: Conjunctivae are normal. Head: Atraumatic. Nose: No congestion/rhinnorhea. Mouth/Throat: Mucous membranes are moist. Neck: No stridor.  Cardiovascular: Normal rate, regular rhythm. Grossly normal heart sounds.  Good peripheral circulation. Respiratory: Normal respiratory effort.  No retractions. Lungs CTAB. Gastrointestinal: Soft and nontender. No distention. Musculoskeletal: No lower extremity tenderness nor edema. Neurologic:  Normal speech and language. No gross focal neurologic deficits are appreciated.  Skin:  Skin is warm, dry and intact. No  rash noted. Psychiatric: Mood and affect are normal. Speech and behavior are normal.  ____________________________________________   LABS (all labs ordered are listed, but only abnormal results are displayed)  Labs Reviewed  COMPREHENSIVE METABOLIC PANEL - Abnormal; Notable for the following components:      Result Value   Glucose, Bld 101 (*)    Alkaline Phosphatase 31 (*)    All other components within normal limits  FIBRIN DERIVATIVES D-DIMER ([facility] ONLY) - Abnormal; Notable for the following components:   Fibrin derivatives D-dimer [facility]) 916.43 (*)    All other components within normal limits  CBC WITH DIFFERENTIAL/PLATELET  TROPONIN I  POC URINE PREG, ED  POCT PREGNANCY, URINE   ____________________________________________  EKG  Reviewed notes by me at 615 Heart rate 115 QRS 70 QTc 440 Sinus tachycardia.  No evidence of ischemia or other abnormality noted ____________________________________________  RADIOLOGY  Chest x-ray reviewed, normal ____________________________________________   PROCEDURES  Procedure(s) performed: None  Procedures  Critical Care performed: No  ____________________________________________   INITIAL IMPRESSION / ASSESSMENT AND PLAN / ED COURSE  Pertinent labs & imaging results that were available during my care of the patient were reviewed by me and considered in my medical decision making (see chart for details).   Patient presents for evaluation of a brief episode of feeling breathless and chest discomfort is hard to describe.  It sounds very atypical of ACS.  Occurred while at rest.  She has no strong family history of ACS.  Is not radiating, not associated any nausea or vomiting.  She is young and relatively healthy with exception to depression.  She reports her symptoms are all essentially resolved and she is very calm and pleasant at this time.  Mom reports similar symptoms seem to be escalating in nature over several months to  years thought secondary to anxiety.  Her EKG is reassuring.  Her first troponin is normal, and given her symptomatology I do not believe this is ACS and do not feel a second troponin would be of clinical utility.  Her chest x-ray is clear.  Her lungs are clear and her examination is normal at this time.  I will, screen for pulmonary embolism as she is low risk utilizing d-dimer.  Her only risk factor for this appears to be use of estrogen birth control.  She is a non-smoker.      ----------------------------------------- 10:00 AM on 11/19/2018 -----------------------------------------  Patient CT negative.  Normal CT Angie of the chest.  Will discharge patient to home, resting comfortably no distress.  Will follow closely with her primary. Return precautions and treatment recommendations and follow-up discussed with the patient who is agreeable with the plan.   ____________________________________________   FINAL CLINICAL IMPRESSION(S) / ED DIAGNOSES  Final diagnoses:  Chest pain, atypical        Note:  This document was prepared using Dragon voice recognition software and  may include unintentional dictation errors       Sharyn Creamer, MD 11/19/18 1001

## 2019-05-23 ENCOUNTER — Other Ambulatory Visit: Payer: Self-pay

## 2019-05-23 ENCOUNTER — Ambulatory Visit
Admission: EM | Admit: 2019-05-23 | Discharge: 2019-05-23 | Disposition: A | Discharge status: Home / Self Care | Payer: BLUE CROSS/BLUE SHIELD | Attending: Emergency Medicine | Admitting: Emergency Medicine

## 2019-05-23 DIAGNOSIS — K0889 Other specified disorders of teeth and supporting structures: Secondary | ICD-10-CM

## 2019-05-23 MED ORDER — LIDOCAINE VISCOUS HCL 2 % MT SOLN: 5.0000 mL | Freq: Four times a day (QID) | OROMUCOSAL | 0 refills | Status: DC | PRN

## 2019-05-23 MED ORDER — AMOXICILLIN-POT CLAVULANATE 875-125 MG PO TABS: 1.0000 | ORAL_TABLET | Freq: Two times a day (BID) | ORAL | 0 refills | Status: DC

## 2019-05-23 NOTE — ED Triage Notes (Signed)
Patient states that she is here for swelling in her gums. Patient states that she noticed this around 3 days ago.

## 2019-05-23 NOTE — ED Provider Notes (Signed)
[facility] ____________________________________________  Time seen: Approximately 2:24 PM  I have reviewed the triage vital signs and the nursing notes.   HISTORY  Chief Complaint Dental Pain    HPI Jacqueline Krueger is a 23 y.o. female presenting for evaluation of right upper gumline swelling and pain present for the last 3 days.  States that she knows that she has a wisdom tooth starting to cut through and has a dentist appointment for this next coming Tuesday.  States she has had increased pain and even noticed some swelling to her right cheek.  Denies fevers.  Overall continues to eat and drink well.  No recent sickness.  No recent antibiotic use.  Denies pregnancy.  Denies aggravating or alleviating factors.   Past Medical History:  Diagnosis Date  . Seizures ([place])     There are no active problems to display for this patient.   Past Surgical History:  Procedure Laterality Date  . EYE SURGERY      Current Outpatient Rx  . Order #: [id number] Class: Historical Med  . Order #: [id number] Class: Normal  . Order #: [id number] Class: Normal  . Order #: [id number] Class: Historical Med    Allergies Patient has no known allergies.  Family History  Problem Relation Age of Onset  . Hypertension Father     Social History Social History   Tobacco Use  . Smoking status: Never Smoker  . Smokeless tobacco: Never Used  Substance Use Topics  . Alcohol use: Yes    Comment: occasionally  . Drug use: No    Review of Systems Constitutional: No fever/chills. Reports continues to eat and drink foods and fluids well.  Eyes: No visual changes. ENT: No sore throat. Cardiovascular: Denies chest pain. Respiratory: Denies shortness of breath. Gastrointestinal: No abdominal pain.  No nausea, no vomiting.  Genitourinary: Negative for dysuria. Musculoskeletal: Negative for back pain. Skin: Negative for rash.   ____________________________________________   PHYSICAL  EXAM:  VITAL SIGNS: ED Triage Vitals  Enc Vitals Group     BP 05/23/19 1243 107/78     Pulse Rate 05/23/19 1243 100     Resp 05/23/19 1243 17     Temp 05/23/19 1243 98.5 F (36.9 C)     Temp Source 05/23/19 1243 Oral     SpO2 05/23/19 1243 100 %     Weight 05/23/19 1242 180 lb (81.6 kg)     Height 05/23/19 1242 5\' 3"  (1.6 m)     Head Circumference --      Peak Flow --      Pain Score 05/23/19 1242 4     Pain Loc --      Pain Edu? --      Excl. in [place]? --     Constitutional: Alert and oriented. Well appearing and in no acute distress. Eyes: Conjunctivae are normal.  Head: Atraumatic. Nose: No congestion Mouth/Throat: Mucous membranes are moist.  Oropharynx non-erythematous. Periodontal Exam   Right upper gumline swelling along teeth depicted above with diffuse tenderness and increased tenderness to posterior molar and wisdom tooth with gum swelling over wisdom tooth, no visible abscess, mild right cheek swelling without erythema or induration, widespread dental decay. Neck: No stridor.  Hematological/Lymphatic/Immunilogical: No cervical lymphadenopathy. Cardiovascular:   Normal rate, regular rhythm. Grossly normal heart sounds. Good peripheral circulation. Respiratory: Normal respiratory effort.  No retractions. Musculoskeletal: No lower or upper extremity tenderness nor edema. No midline cervical, thoracic or lumbar tenderness to palpation.  Neurologic:  Normal speech and language.  Skin:  Skin is warm, dry and intact. No rash noted. Psychiatric: Mood and affect are normal. Speech and behavior are normal.  ____________________________________________   LABS (all labs ordered are listed, but only abnormal results are displayed)  Labs Reviewed - No data to display ____________________________________________   INITIAL IMPRESSION / ASSESSMENT AND PLAN / ED COURSE  Pertinent labs & imaging results that were available during my care of the patient were reviewed by me and  considered in my medical decision making (see chart for details).  Well-appearing patient.  No acute distress.  Suspect wisdom tooth embedded with secondary infection, will treat with oral Augmentin and viscous lidocaine.  Over-the-counter Tylenol and ibuprofen.  Follow-up closely with dentist.  Mouth rinses.  Soft food diet.Discussed indication, risks and benefits of medications with patient.  Patient was advised to see the dentist within 10 days.  Discussed follow-up and return parameters.  Patient agrees with plan. ____________________________________________   FINAL CLINICAL IMPRESSION(S) / ED DIAGNOSES  Final diagnoses:  Pain, dental         Renford DillsMiller, Geneieve Duell, NP 05/23/19 1427

## 2019-05-23 NOTE — Discharge Instructions (Addendum)
Take medication as prescribed. Rest. Drink plenty of fluids.  Over-the-counter ibuprofen.  Follow-up with her dentist as scheduled.  Eat soft foods.  Follow up with your primary care physician this week as needed. Return to Urgent care for new or worsening concerns.

## 2019-06-12 ENCOUNTER — Ambulatory Visit (INDEPENDENT_AMBULATORY_CARE_PROVIDER_SITE_OTHER): Payer: BC Managed Care – PPO

## 2019-06-12 ENCOUNTER — Ambulatory Visit
Admission: EM | Admit: 2019-06-12 | Discharge: 2019-06-12 | Disposition: A | Discharge status: Home / Self Care | Payer: BC Managed Care – PPO | Attending: Internal Medicine | Admitting: Internal Medicine

## 2019-06-12 ENCOUNTER — Other Ambulatory Visit: Payer: Self-pay

## 2019-06-12 DIAGNOSIS — J029 Acute pharyngitis, unspecified: Secondary | ICD-10-CM

## 2019-06-12 DIAGNOSIS — J069 Acute upper respiratory infection, unspecified: Secondary | ICD-10-CM

## 2019-06-12 DIAGNOSIS — R05 Cough: Secondary | ICD-10-CM | POA: Diagnosis not present

## 2019-06-12 DIAGNOSIS — R000 Tachycardia, unspecified: Secondary | ICD-10-CM | POA: Diagnosis not present

## 2019-06-12 LAB — RAPID INFLUENZA A&B ANTIGENS
Influenza A (ARMC): NEGATIVE
Influenza B (ARMC): NEGATIVE

## 2019-06-12 LAB — RAPID STREP SCREEN (MED CTR MEBANE ONLY): Streptococcus, Group A Screen (Direct): NEGATIVE

## 2019-06-12 MED ORDER — BENZONATATE 200 MG PO CAPS: ORAL_CAPSULE | ORAL | 0 refills | Status: DC

## 2019-06-12 MED ORDER — HYDROCOD POLST-CPM POLST ER 10-8 MG/5ML PO SUER: 5.0000 mL | Freq: Two times a day (BID) | ORAL | 0 refills | Status: DC

## 2019-06-12 NOTE — ED Triage Notes (Addendum)
Reports nonproductive cough, nasal congestion and sore throat X 1 week. Denies fever. Pt alert and oriented X4, cooperative, RR even and unlabored, color WNL. Pt in NAD. Pt is tested weekly for coronavirus at her work. Last test was over 1 week ago, negative.

## 2019-06-12 NOTE — Discharge Instructions (Addendum)
Drink plenty of fluids and get sufficient rest.  Use caution while taking the Tussionex cough syrup.  Do not drive or perform activities requiring concentration.  If you worsen or not improving return to our clinic.  We will hold you off work until results of the [place] testing have been reported.  If they are negative you may return to work when you are feeling better.

## 2019-06-12 NOTE — ED Provider Notes (Signed)
[facility]    CSN: [id number] Arrival date & time: 06/12/19  1022      History   Chief Complaint Chief Complaint  Patient presents with  . Cough  . Sore Throat    HPI Jacqueline Krueger is a 23 y.o. female.   HPI  23 year old female presents today with a one-week history nonproductive cough nasal congestion and sore throat.  She has had no fevers.  States that her boyfriend had very similar symptoms prior to the start of her symptoms.  He works at 1 of the local nursing homes and they test them on a weekly basis for [place].  All of hers have been negative so far.  Her last 1 was about a week ago.  She does have fatigue but no loss of sense of smell or taste, no shortness of breath.  She does have a temperature of 99.1 today and a pulse rate of 112.      Past Medical History:  Diagnosis Date  . Seizures ([place])     There are no active problems to display for this patient.   Past Surgical History:  Procedure Laterality Date  . EYE SURGERY      OB History   No obstetric history on file.      Home Medications    Prior to Admission medications   Medication Sig Start Date End Date Taking? Authorizing Provider  benzonatate (TESSALON) 200 MG capsule Take one cap TID PRN cough 06/12/19   Lorin Picket, PA-C  chlorpheniramine-HYDROcodone [facility]) 10-8 MG/5ML SUER Take 5 mLs by mouth 2 (two) times daily. 06/12/19   Lorin Picket, PA-C  levonorgestrel-ethinyl estradiol (AVIANE,ALESSE,LESSINA) 0.1-20 MG-MCG tablet Take by mouth. 03/10/18 05/23/19  [provider]  venlafaxine (EFFEXOR) 37.5 MG tablet Take 37.5 mg by mouth daily. 11/16/18 06/12/19  [provider]    Family History Family History  Problem Relation Age of Onset  . Hypertension Father     Social History Social History   Tobacco Use  . Smoking status: Never Smoker  . Smokeless tobacco: Never Used  Substance Use Topics  . Alcohol use: Yes    Comment:  occasionally  . Drug use: No     Allergies   Patient has no known allergies.   Review of Systems Review of Systems  Constitutional: Positive for activity change and fatigue. Negative for appetite change, chills, diaphoresis and fever.  HENT: Positive for congestion, rhinorrhea and sore throat.   Respiratory: Positive for cough. Negative for wheezing and stridor.   All other systems reviewed and are negative.    Physical Exam Triage Vital Signs ED Triage Vitals  Enc Vitals Group     BP 06/12/19 1041 133/87     Pulse Rate 06/12/19 1041 (!) 112     Resp 06/12/19 1041 18     Temp 06/12/19 1041 99.1 F (37.3 C)     Temp Source 06/12/19 1041 Oral     SpO2 06/12/19 1041 100 %     Weight 06/12/19 1042 178 lb 9.2 oz (81 kg)     Height 06/12/19 1042 5\' 3"  (1.6 m)     Head Circumference --      Peak Flow --      Pain Score 06/12/19 1042 5     Pain Loc --      Pain Edu? --      Excl. in [place]? --    No data found.  Updated Vital Signs BP 133/87 (  BP Location: Right Arm)   Pulse (!) 112   Temp 99.1 F (37.3 C) (Oral)   Resp 18   Ht 5\' 3"  (1.6 m)   Wt 178 lb 9.2 oz (81 kg)   LMP 05/19/2019   SpO2 100%   BMI 31.63 kg/m   Visual Acuity Right Eye Distance:   Left Eye Distance:   Bilateral Distance:    Right Eye Near:   Left Eye Near:    Bilateral Near:     Physical Exam Vitals signs and nursing note reviewed.  Constitutional:      General: She is not in acute distress.    Appearance: She is well-developed. She is ill-appearing. She is not toxic-appearing or diaphoretic.  HENT:     Head: Normocephalic and atraumatic.     Right Ear: Tympanic membrane and ear canal normal.     Left Ear: Tympanic membrane and ear canal normal.     Nose: No congestion or rhinorrhea.     Mouth/Throat:     Mouth: Mucous membranes are moist. No oral lesions.     Pharynx: Oropharynx is clear. Uvula midline. No pharyngeal swelling, oropharyngeal exudate, posterior oropharyngeal erythema or  uvula swelling.     Tonsils: No tonsillar exudate.  Eyes:     Conjunctiva/sclera: Conjunctivae normal.     Pupils: Pupils are equal, round, and reactive to light.  Neck:     Musculoskeletal: Normal range of motion and neck supple.  Cardiovascular:     Rate and Rhythm: Regular rhythm. Tachycardia present.     Heart sounds: Normal heart sounds.  Pulmonary:     Effort: Pulmonary effort is normal.     Comments: Decreased breath sounds in the left middle lobe in comparison to the right.  No rales rhonchi or wheezing present. Skin:    General: Skin is warm and dry.  Neurological:     General: No focal deficit present.     Mental Status: She is alert and oriented to person, place, and time.  Psychiatric:        Mood and Affect: Mood normal.        Behavior: Behavior normal.      UC Treatments / Results  Labs (all labs ordered are listed, but only abnormal results are displayed) Labs Reviewed  RAPID STREP SCREEN ([facility] ONLY)  RAPID INFLUENZA A&B ANTIGENS ([facility] ONLY)  NOVEL CORONAVIRUS, NAA (HOSP ORDER, SEND-OUT TO REF LAB; TAT 18-24 HRS)  CULTURE, GROUP A STREP [facility])    EKG   Radiology Dg Chest 2 View  Result Date: 06/12/2019 CLINICAL DATA:  Cough, tachycardia EXAM: CHEST - 2 VIEW COMPARISON:  11/19/2018 FINDINGS: Heart and mediastinal contours are within normal limits. No focal opacities or effusions. No acute bony abnormality. IMPRESSION: No active cardiopulmonary disease. Electronically Signed   By: Charlett NoseKevin  Dover M.D.   On: 06/12/2019 11:41    Procedures Procedures (including critical care time)  Medications Ordered in UC Medications - No data to display  Initial Impression / Assessment and Plan / UC Course  I have reviewed the triage vital signs and the nursing notes.  Pertinent labs & imaging results that were available during my care of the patient were reviewed by me and considered in my medical decision making (see chart for details).   23 year old  female presented with a 1 week history of nonproductive cough nasal congestion sore throat and malaise.  She not had significant fever or chills.  Her temperature was 99.1 with a tachycardia  112.  O2 sats were 100% on room air.  Examination revealed decreased breath sounds on the left in comparison to the right.  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  Her last test was over a week ago.  Her boyfriend at home, had similar symptoms before hers developed.  Tests revealed a negative strep test; chest x-ray was normal and influenza testing was also negative.  She likely has a viral upper respiratory infection.  Prescribed symptomatic medications including Tessalon Perles and Tessalon next.  She was given appropriate precautions with use of the Tussionex.  COVID test was obtained today and is pending.  I will keep her out of work until the results of the COVID are resulted.  If they are negative she may return to work.  This was fully explained to the patient and she acknowledged understanding.   Final Clinical Impressions(s) / UC Diagnoses   Final diagnoses:  Upper respiratory tract infection, unspecified type  Tachycardia with heart rate 100-120 beats per minute     Discharge Instructions     Drink plenty of fluids and get sufficient rest.  Use caution while taking the Tussionex cough syrup.  Do not drive or perform activities requiring concentration.  If you worsen or not improving return to our clinic.  We will hold you off work until results of the COVID testing have been reported.  If they are negative you may return to work when you are feeling better.    ED Prescriptions    Medication Sig Dispense Auth. Provider   benzonatate (TESSALON) 200 MG capsule Take one cap TID PRN cough 30 capsule Lutricia Feil, PA-C   chlorpheniramine-HYDROcodone (TUSSIONEX PENNKINETIC ER) 10-8 MG/5ML SUER Take 5 mLs by mouth 2 (two) times daily. 115 mL Lutricia Feil,  PA-C     I have reviewed the PDMP during this encounter.   Lutricia Feil, PA-C 06/12/19 1231

## 2019-06-14 LAB — NOVEL CORONAVIRUS, NAA (HOSP ORDER, SEND-OUT TO REF LAB; TAT 18-24 HRS): SARS-CoV-2, NAA: NOT DETECTED

## 2019-06-15 LAB — CULTURE, GROUP A STREP (THRC)

## 2019-10-04 ENCOUNTER — Other Ambulatory Visit: Payer: Self-pay

## 2019-10-04 ENCOUNTER — Ambulatory Visit
Admission: EM | Admit: 2019-10-04 | Discharge: 2019-10-04 | Disposition: A | Discharge status: Home / Self Care | Payer: BC Managed Care – PPO | Attending: Family Medicine | Admitting: Family Medicine

## 2019-10-04 ENCOUNTER — Ambulatory Visit (INDEPENDENT_AMBULATORY_CARE_PROVIDER_SITE_OTHER): Payer: BC Managed Care – PPO

## 2019-10-04 DIAGNOSIS — R05 Cough: Secondary | ICD-10-CM

## 2019-10-04 DIAGNOSIS — J069 Acute upper respiratory infection, unspecified: Secondary | ICD-10-CM | POA: Diagnosis not present

## 2019-10-04 MED ORDER — DOXYCYCLINE HYCLATE 100 MG PO CAPS: 100.0000 mg | ORAL_CAPSULE | Freq: Two times a day (BID) | ORAL | 0 refills | Status: DC

## 2019-10-04 MED ORDER — BENZONATATE 100 MG PO CAPS: 100.0000 mg | ORAL_CAPSULE | Freq: Three times a day (TID) | ORAL | 0 refills | Status: DC | PRN

## 2019-10-04 MED ORDER — HYDROCOD POLST-CPM POLST ER 10-8 MG/5ML PO SUER: 5.0000 mL | Freq: Every evening | ORAL | 0 refills | Status: DC | PRN

## 2019-10-04 NOTE — Discharge Instructions (Addendum)
Take medication as prescribed. Rest. Drink plenty of fluids.  ° °Follow up with your primary care physician this week as needed. Return to Urgent care for new or worsening concerns.  ° °

## 2019-10-04 NOTE — ED Triage Notes (Signed)
Patient complains of cough, congestion, sore throat, headache x 2 weeks. Was positive for Covid in early December.

## 2019-10-04 NOTE — ED Provider Notes (Signed)
[facility] ____________________________________________  Time seen: Approximately 8:53 AM  I have reviewed the triage vital signs and the nursing notes.  HISTORY  Chief Complaint Cough  HPI Jacqueline Krueger is a 24 y.o. female present for evaluation of 2 weeks of cough and congestion complaints.  Patient reports that initially started off with cold-like symptoms but symptoms have continued.  Continues with nasal drainage and chest congestion.  States cough is sometimes productive.  Denies sore throat, but intermittently scratchy.  Denies chest pain or shortness of breath.  No accompanying fevers.  Did have COVID-19 at the beginning of December in which she reports she was asymptomatic, and was tested at her work.  Denies hemoptysis, extremity swelling, vomiting, diarrhea.  Continues to overall eat and drink well.  Unresolved with over-the-counter cough congestion medications.  Denies other recent sickness. Denies pregnancy.   Past Medical History:  Diagnosis Date  . Seizures ([facility])     There are no problems to display for this patient.   Past Surgical History:  Procedure Laterality Date  . EYE SURGERY       No current facility-administered medications for this encounter.  Current Outpatient Medications:  .  levonorgestrel-ethinyl estradiol (AVIANE,ALESSE,LESSINA) 0.1-20 MG-MCG tablet, Take by mouth., Disp: , Rfl:  .  benzonatate (TESSALON PERLES) 100 MG capsule, Take 1 capsule (100 mg total) by mouth 3 (three) times daily as needed for cough., Disp: 15 capsule, Rfl: 0 .  chlorpheniramine-HYDROcodone (TUSSIONEX PENNKINETIC ER) 10-8 MG/5ML SUER, Take 5 mLs by mouth at bedtime as needed for cough. do not drive or operate machinery while taking as can cause drowsiness., Disp: 40 mL, Rfl: 0 .  doxycycline (VIBRAMYCIN) 100 MG capsule, Take 1 capsule (100 mg total) by mouth 2 (two) times daily., Disp: 20 capsule, Rfl: 0  Allergies Patient has no known allergies.  Family  History  Problem Relation Age of Onset  . Hypertension Father     Social History Social History   Tobacco Use  . Smoking status: Never Smoker  . Smokeless tobacco: Never Used  Substance Use Topics  . Alcohol use: Yes    Comment: occasionally  . Drug use: No    Review of Systems Constitutional: No fever ENT: As above. Cardiovascular: Denies chest pain. Respiratory: Denies shortness of breath. Gastrointestinal: No abdominal pain.  No nausea, no vomiting.  No diarrhea. Genitourinary: Negative for dysuria. Musculoskeletal: Negative for back pain. Skin: Negative for rash.   ____________________________________________   PHYSICAL EXAM:  VITAL SIGNS: ED Triage Vitals  Enc Vitals Group     BP 10/04/19 0838 109/76     Pulse Rate 10/04/19 0838 (!) 119 Recheck 106     Resp 10/04/19 0838 18     Temp 10/04/19 0838 98.6 F (37 C)     Temp Source 10/04/19 0838 Oral     SpO2 10/04/19 0838 100 %     Weight 10/04/19 0835 180 lb (81.6 kg)     Height 10/04/19 0835 5\' 3"  (1.6 m)     Head Circumference --      Peak Flow --      Pain Score 10/04/19 0834 5     Pain Loc --      Pain Edu? --      Excl. in [place]? --    Constitutional: Alert and oriented. Well appearing and in no acute distress. Eyes: Conjunctivae are normal.  Head: Atraumatic.  No swelling. No erythema.  Ears: no erythema, normal TMs bilaterally.   Nose:Nasal congestion  Neck: No stridor.  No cervical spine tenderness to palpation. Hematological/Lymphatic/Immunilogical: No cervical lymphadenopathy. Cardiovascular: Normal rate, regular rhythm. Grossly normal heart sounds.  Good peripheral circulation. Respiratory: Normal respiratory effort.  No retractions. No wheezes.  Mild scattered rhonchi.  Occasional dry cough.  Good air movement.  Musculoskeletal: Ambulatory with steady gait.  No lower extremity edema noted bilaterally. Neurologic:  Normal speech and language. No gait instability. Skin:  Skin appears warm, dry  and intact. No rash noted. Psychiatric: Mood and affect are normal. Speech and behavior are normal.  ___________________________________________   LABS (all labs ordered are listed, but only abnormal results are displayed)  Labs Reviewed - No data to display ____________________________________________  RADIOLOGY  DG Chest 2 View  Result Date: 10/04/2019 CLINICAL DATA:  Cough and congestion.  Recent COVID-19 positive EXAM: CHEST - 2 VIEW COMPARISON:  June 12, 2019 FINDINGS: The lungs are clear. The heart size and pulmonary vascularity are normal. No adenopathy. No bone lesions. IMPRESSION: No abnormality noted. Electronically Signed   By: Lowella Grip III M.D.   On: 10/04/2019 09:11   ____________________________________________   PROCEDURES Procedures     INITIAL IMPRESSION / ASSESSMENT AND PLAN / ED COURSE  Pertinent labs & imaging results that were available during my care of the patient were reviewed by me and considered in my medical decision making (see chart for details).  Overall well-appearing patient.  No acute distress.  Chest x-ray as above radiologist, no abnormality.  Will empirically treat with oral doxycycline,.  Tessalon Perles appearing Tussionex.  Encourage rest, fluids, supportive care.Discussed indication, risks and benefits of medications with patient.   Discussed follow up with Primary care physician this week. Discussed follow up and return parameters including no resolution or any worsening concerns. Patient verbalized understanding and agreed to plan.   ____________________________________________   FINAL CLINICAL IMPRESSION(S) / ED DIAGNOSES  Final diagnoses:  Viral URI with cough     ED Discharge Orders         Ordered    doxycycline (VIBRAMYCIN) 100 MG capsule  2 times daily     10/04/19 0915    benzonatate (TESSALON PERLES) 100 MG capsule  3 times daily PRN     10/04/19 0915    chlorpheniramine-HYDROcodone (TUSSIONEX PENNKINETIC  ER) 10-8 MG/5ML SUER  At bedtime PRN     10/04/19 0915           Note: This dictation was prepared with Dragon dictation along with smaller phrase technology. Any transcriptional errors that result from this process are unintentional.         Marylene Land, NP 10/04/19 [phone]

## 2019-12-30 DIAGNOSIS — T50902A Poisoning by unspecified drugs, medicaments and biological substances, intentional self-harm, initial encounter: Secondary | ICD-10-CM | POA: Insufficient documentation

## 2020-03-08 ENCOUNTER — Other Ambulatory Visit: Payer: Self-pay | Admitting: Neurology

## 2020-03-08 DIAGNOSIS — G40909 Epilepsy, unspecified, not intractable, without status epilepticus: Secondary | ICD-10-CM

## 2020-03-25 ENCOUNTER — Other Ambulatory Visit: Payer: Self-pay

## 2020-03-25 ENCOUNTER — Ambulatory Visit
Admission: RE | Admit: 2020-03-25 | Discharge: 2020-03-25 | Disposition: A | Discharge status: Home / Self Care | Payer: BC Managed Care – PPO | Source: Ambulatory Visit | Attending: Neurology | Admitting: Neurology

## 2020-03-25 DIAGNOSIS — G40909 Epilepsy, unspecified, not intractable, without status epilepticus: Secondary | ICD-10-CM | POA: Diagnosis not present

## 2020-03-25 MED ORDER — GADOBUTROL 1 MMOL/ML IV SOLN
7.5000 mL | Freq: Once | INTRAVENOUS | Status: AC | PRN
  Administered 2020-03-25: 7.5 mL via INTRAVENOUS

## 2020-05-22 IMAGING — CR DG CHEST 2V
2 series · 2 of 2 positions shown · non-contrast
Comparison: 11/19/2018

CLINICAL DATA: Cough, tachycardia

EXAM:
CHEST - 2 VIEW

[chest pa]
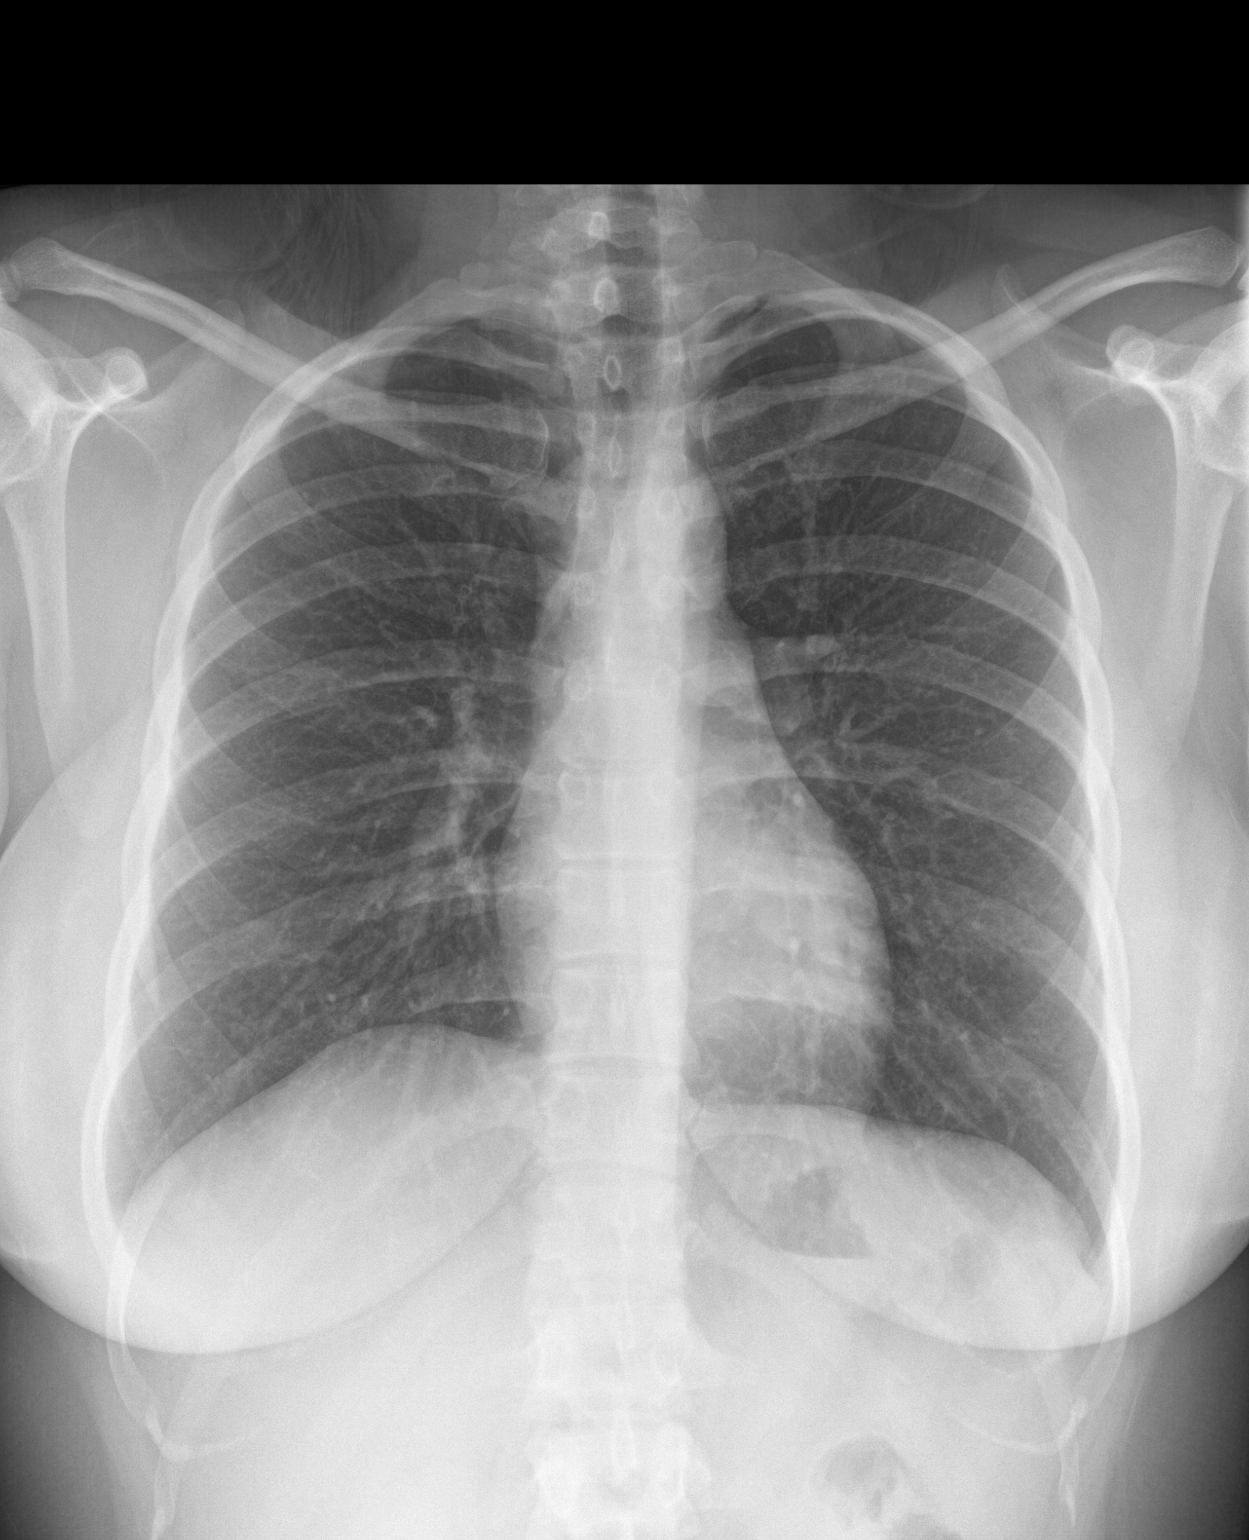

[chest lat]
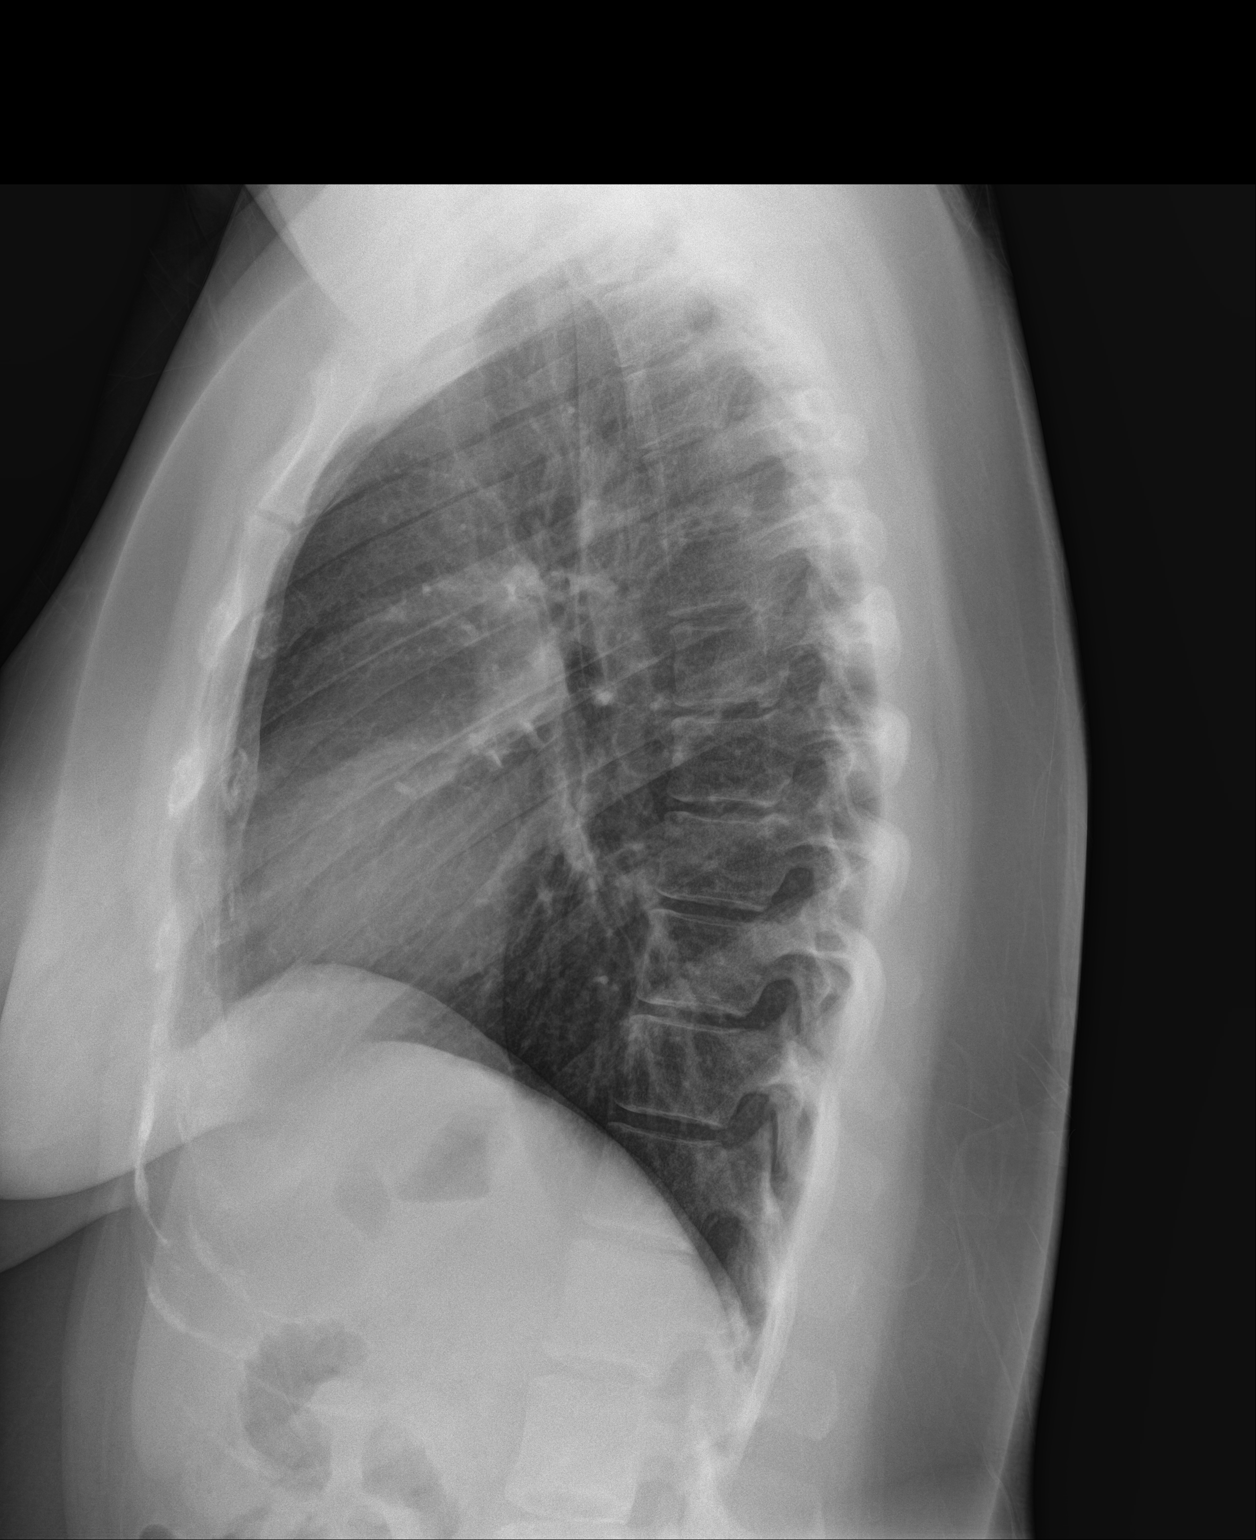

[2 of 2 positions shown; findings below may reference images not displayed]

FINDINGS: Heart and mediastinal contours are within normal limits. No focal
opacities or effusions. No acute bony abnormality.
IMPRESSION: No active cardiopulmonary disease.

## 2020-09-13 IMAGING — CR DG CHEST 2V
2 series · 2 of 2 positions shown · non-contrast
Comparison: June 12, 2019

CLINICAL DATA: Cough and congestion.  Recent [id number] positive

EXAM:
CHEST - 2 VIEW

[chest pa]
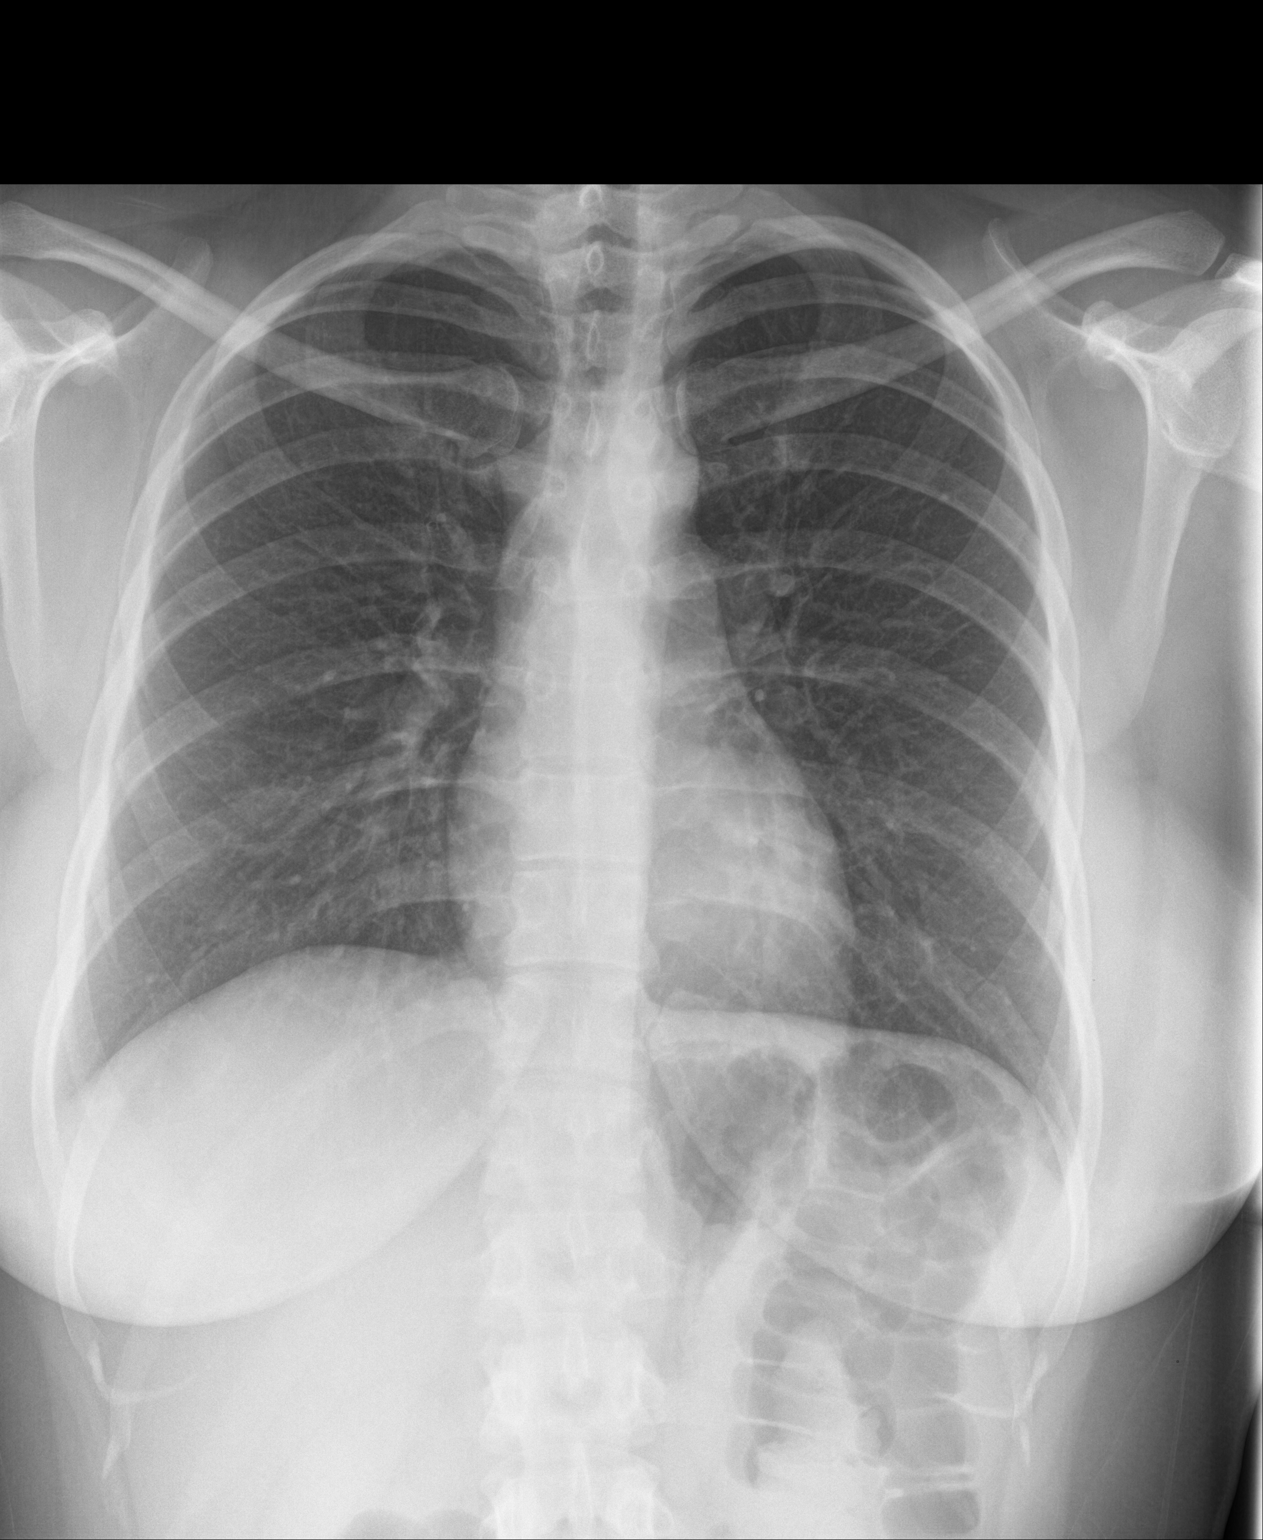

[chest lat]
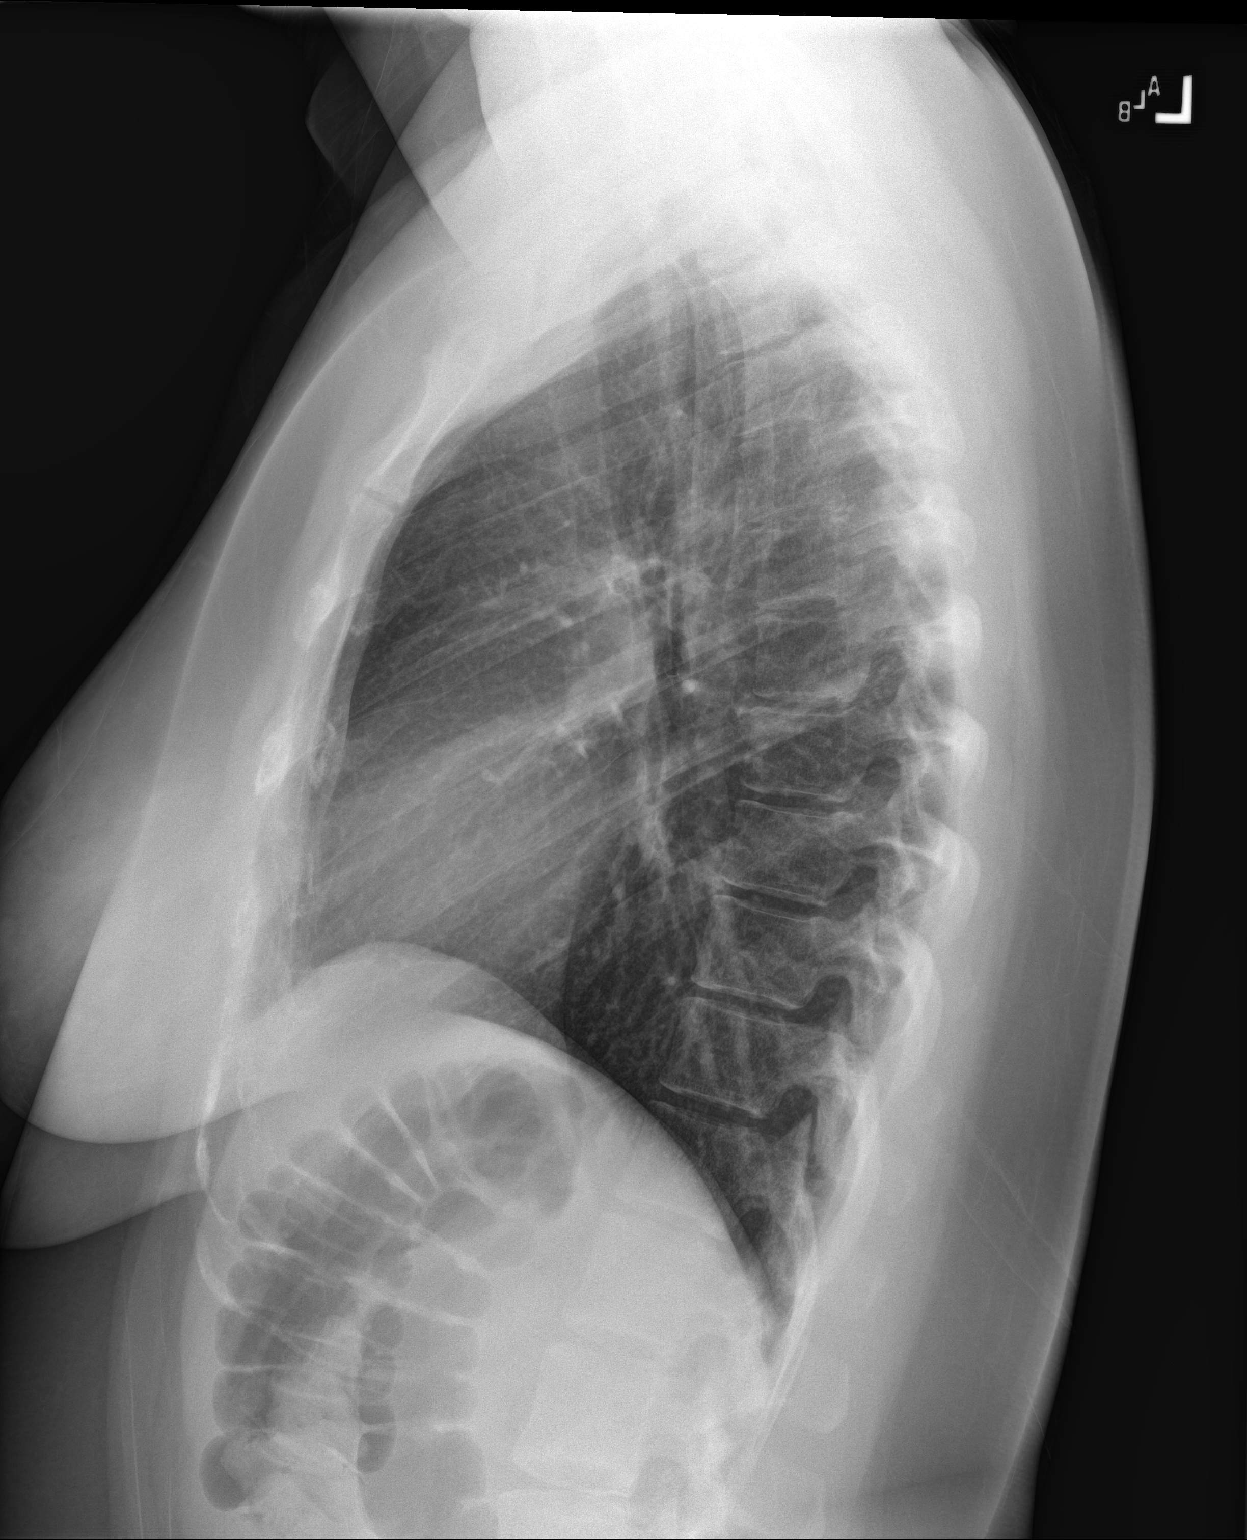

[2 of 2 positions shown; findings below may reference images not displayed]

FINDINGS: The lungs are clear. The heart size and pulmonary vascularity are
normal. No adenopathy. No bone lesions.
IMPRESSION: No abnormality noted.

## 2020-09-14 NOTE — L&D Delivery Note (Signed)
Vaginal Delivery Note  Spontaneous delivery of live viable female infant from the ROA position through an intact perineum. Delivery of anterior left shoulder with gentle downward guidance followed by delivery of the right posterior shoulder with gentle upward guidance. Body followed spontaneously. Infant placed on maternal chest. Nursery present and helped with neonatal resuscitation and evaluation. Cord clamped and cut after one minute. Cord blood collected. Placenta delivered spontaneously and intact with a 3 vessel cord.  2nd degree perineal repaired, superficial bilateral periurethral lacerations. Uterus firm and below umbilicus at the end of the delivery.  Mom and baby recovering in stable condition. Sponge and needle counts were correct at the end of the delivery.  APGARS: 1 minute:7 5 minutes: 9 Weight: pending Epidural: Present Laceration: 2nd degree perineal, superficial periurethral laceration  Adelene Idler MD [facility], [facility] 06/15/21 2:22 PM

## 2020-11-11 ENCOUNTER — Encounter: Payer: Self-pay | Admitting: Emergency Medicine

## 2020-11-11 ENCOUNTER — Ambulatory Visit
Admission: EM | Admit: 2020-11-11 | Discharge: 2020-11-11 | Disposition: A | Discharge status: Home / Self Care | Payer: BC Managed Care – PPO | Attending: Sports Medicine | Admitting: Sports Medicine

## 2020-11-11 ENCOUNTER — Other Ambulatory Visit: Payer: Self-pay

## 2020-11-11 ENCOUNTER — Ambulatory Visit: Payer: Self-pay

## 2020-11-11 DIAGNOSIS — Z3201 Encounter for pregnancy test, result positive: Secondary | ICD-10-CM | POA: Diagnosis not present

## 2020-11-11 DIAGNOSIS — N912 Amenorrhea, unspecified: Secondary | ICD-10-CM

## 2020-11-11 DIAGNOSIS — Z3A01 Less than 8 weeks gestation of pregnancy: Secondary | ICD-10-CM | POA: Diagnosis not present

## 2020-11-11 DIAGNOSIS — R110 Nausea: Secondary | ICD-10-CM | POA: Diagnosis not present

## 2020-11-11 DIAGNOSIS — O26891 Other specified pregnancy related conditions, first trimester: Secondary | ICD-10-CM | POA: Diagnosis not present

## 2020-11-11 DIAGNOSIS — F17210 Nicotine dependence, cigarettes, uncomplicated: Secondary | ICD-10-CM | POA: Insufficient documentation

## 2020-11-11 DIAGNOSIS — O99331 Smoking (tobacco) complicating pregnancy, first trimester: Secondary | ICD-10-CM | POA: Insufficient documentation

## 2020-11-11 LAB — PREGNANCY, URINE: Preg Test, Ur: POSITIVE — AB

## 2020-11-11 NOTE — ED Triage Notes (Signed)
Pt c/o nausea, missed period, and breast tenderness. She states she took a home pregnancy test and came back positive. She states her last period was about 3-4 weeks ago. She was suppose to start her period at the end of January.

## 2020-11-11 NOTE — ED Provider Notes (Signed)
[facility]    CSN: [id number] Arrival date & time: 11/11/20  1032      History   Chief Complaint Chief Complaint  Patient presents with  . Appointment  . Amenorrhea  . Nausea    HPI Jacqueline Krueger is a 25 y.o. female.   Patient pleasant 25 year old female who presents for evaluation of the above issues.  She reports several weeks now of nausea but no vomiting and some breast tenderness.  She took a home pregnancy test 2 days ago and it was positive.  Her last menstrual period was sometime in late December.  She is on the OCP but when I asked her about if she missed any doses she does not really answer directly.  She says that it is a wanted pregnancy but does admit that she is having some trouble with a boyfriend who does not want to have a baby.  She said that she will have it on her own if she has to.  She does not have an OB/GYN.  She is not on any prenatal vitamins.  She denies any bleeding, spotting, or discharge.  She does get some occasional cramping with her menses but nothing recently.  No urinary symptoms including hematuria dysuria increased frequency urgency.  She has never been pregnant in the past.  No red flag signs or symptoms are elicited on history.     Past Medical History:  Diagnosis Date  . Seizures [facility])     Patient Active Problem List   Diagnosis Date Noted  . Supervision of high risk pregnancy, antepartum 11/13/2020    Past Surgical History:  Procedure Laterality Date  . EYE SURGERY      OB History    Gravida  1   Para      Term      Preterm      AB      Living        SAB      IAB      Ectopic      Multiple      Live Births               Home Medications    Prior to Admission medications   Medication Sig Start Date End Date Taking? Authorizing Provider  benzonatate (TESSALON PERLES) 100 MG capsule Take 1 capsule (100 mg total) by mouth 3 (three) times daily as needed for cough. Patient not taking:  Reported on 11/13/2020 10/04/19   Renford Dills, NP  chlorpheniramine-HYDROcodone [facility]) 10-8 MG/5ML SUER Take 5 mLs by mouth at bedtime as needed for cough. do not drive or operate machinery while taking as can cause drowsiness. Patient not taking: Reported on 11/13/2020 10/04/19   Renford Dills, NP  doxycycline (VIBRAMYCIN) 100 MG capsule Take 1 capsule (100 mg total) by mouth 2 (two) times daily. Patient not taking: Reported on 11/13/2020 10/04/19   Renford Dills, NP  levonorgestrel-ethinyl estradiol [facility] Mane) 0.1-20 MG-MCG tablet Take by mouth. 03/10/18 10/04/19  [provider]  venlafaxine (EFFEXOR) 37.5 MG tablet Take 37.5 mg by mouth daily. 11/16/18 06/12/19  [provider]    Family History Family History  Problem Relation Age of Onset  . Hypertension Father     Social History Social History   Tobacco Use  . Smoking status: Current Every Day Smoker    Packs/day: 0.50    Types: Cigarettes  . Smokeless tobacco: Never Used  Vaping Use  . Vaping Use: Never used  Substance Use Topics  . Alcohol use: Yes    Comment: occasionally  . Drug use: No     Allergies   Patient has no known allergies.   Review of Systems Review of Systems  Constitutional: Negative.  Negative for appetite change, chills, diaphoresis, fatigue and fever.  HENT: Negative.   Gastrointestinal: Positive for nausea. Negative for blood in stool, constipation, diarrhea and vomiting.  Genitourinary: Negative for difficulty urinating, dysuria, flank pain, frequency, hematuria, pelvic pain, urgency, vaginal bleeding, vaginal discharge and vaginal pain.  Musculoskeletal: Negative.   Skin: Negative.   Neurological: Negative.      Physical Exam Triage Vital Signs ED Triage Vitals  Enc Vitals Group     BP 11/11/20 1054 114/73     Pulse Rate 11/11/20 1054 99     Resp 11/11/20 1054 18     Temp 11/11/20 1054 98.7 F (37.1 C)     Temp Source 11/11/20 1054 Oral      SpO2 11/11/20 1054 100 %     Weight 11/11/20 1052 145 lb (65.8 kg)     Height 11/11/20 1052 5\' 3"  (1.6 m)     Head Circumference --      Peak Flow --      Pain Score 11/11/20 1052 0     Pain Loc --      Pain Edu? --      Excl. in GC? --    No data found.  Updated Vital Signs BP 114/73 (BP Location: Left Arm)   Pulse 99   Temp 98.7 F (37.1 C) (Oral)   Resp 18   Ht 5\' 3"  (1.6 m)   Wt 65.8 kg   LMP 10/07/2020 (Approximate)   SpO2 100%   BMI 25.69 kg/m   Visual Acuity Right Eye Distance:   Left Eye Distance:   Bilateral Distance:    Right Eye Near:   Left Eye Near:    Bilateral Near:     Physical Exam Vitals and nursing note reviewed.  Constitutional:      General: She is not in acute distress.    Appearance: Normal appearance. She is not ill-appearing or toxic-appearing.  HENT:     Head: Normocephalic and atraumatic.  Cardiovascular:     Rate and Rhythm: Normal rate and regular rhythm.     Pulses: Normal pulses.     Heart sounds: Normal heart sounds.  Pulmonary:     Effort: Pulmonary effort is normal.     Breath sounds: Normal breath sounds.  Abdominal:     Tenderness: There is no abdominal tenderness. There is no right CVA tenderness, left CVA tenderness, guarding or rebound.  Musculoskeletal:        General: Normal range of motion.     Cervical back: Normal range of motion and neck supple.  Skin:    General: Skin is warm and dry.     Capillary Refill: Capillary refill takes less than 2 seconds.  Neurological:     General: No focal deficit present.     Mental Status: She is alert and oriented to person, place, and time.      UC Treatments / Results  Labs (all labs ordered are listed, but only abnormal results are displayed) Labs Reviewed  PREGNANCY, URINE - Abnormal; Notable for the following components:      Result Value   Preg Test, Ur POSITIVE (*)    All other components within normal limits    EKG   Radiology No results  found.  Procedures Procedures (including critical care time)  Medications Ordered in UC Medications - No data to display  Initial Impression / Assessment and Plan / UC Course  I have reviewed the triage vital signs and the nursing notes.  Pertinent labs & imaging results that were available during my care of the patient were reviewed by me and considered in my medical decision making (see chart for details).  Clinical impression: 25 year old female with 2 weeks of nausea with a positive home pregnancy test confirmed today in the office.  This is her first pregnancy and other than the nausea she is asymptomatic.  Treatment plan: 1.  The findings and treatment plan were discussed in detail with the patient.  Patient was in agreement. 2.  I recommended that she start prenatal vitamins and go to the drugstore and initiate that. 3.  She needs to establish care with OB/GYN and I gave her a number to call for [facility] and hopefully they can see her soon. 4.  I did indicate that if she developed any abdominal pain, bleeding, spotting, or nausea that progressed to vomiting that was intractable that she needs to go to the emergency room.  She voiced verbal understanding. 5.  Educational handout was provided. 6.  She can take Tylenol only no NSAIDs.  She can also stop her OCP. 7.  Follow-up here as needed.    Final Clinical Impressions(s) / UC Diagnoses   Final diagnoses:  Less than [redacted] weeks gestation of pregnancy  Amenorrhea  Nausea     Discharge Instructions     Your pregnancy test was positive. You need to start prenatal vitamins.  Please go to the drugstore when you leave here and initiate that. You need to establish care with OB/GYN.  I gave you the information for [facility].  Please call them today and schedule an appointment. If you develop significant abdominal pain, bleeding, spotting, or your nausea progresses and involves vomiting please seek out the care of the emergency  room.  Congratulations on your pregnancy, Dr. Zachery Dauer    ED Prescriptions    None     PDMP not reviewed this encounter.   Delton See, MD 11/13/20 1719

## 2020-11-11 NOTE — Discharge Instructions (Signed)
Your pregnancy test was positive. You need to start prenatal vitamins.  Please go to the drugstore when you leave here and initiate that. You need to establish care with OB/GYN.  I gave you the information for [facility].  Please call them today and schedule an appointment. If you develop significant abdominal pain, bleeding, spotting, or your nausea progresses and involves vomiting please seek out the care of the emergency room.  Congratulations on your pregnancy, Dr. Zachery Dauer

## 2020-11-13 ENCOUNTER — Other Ambulatory Visit: Payer: Self-pay

## 2020-11-13 ENCOUNTER — Ambulatory Visit (INDEPENDENT_AMBULATORY_CARE_PROVIDER_SITE_OTHER): Payer: BC Managed Care – PPO | Admitting: Obstetrics

## 2020-11-13 ENCOUNTER — Other Ambulatory Visit (HOSPITAL_COMMUNITY)
Admission: RE | Admit: 2020-11-13 | Discharge: 2020-11-13 | Disposition: A | Discharge status: Home / Self Care | Payer: BC Managed Care – PPO | Source: Ambulatory Visit | Attending: Obstetrics | Admitting: Obstetrics

## 2020-11-13 ENCOUNTER — Encounter: Payer: Self-pay | Admitting: Obstetrics

## 2020-11-13 VITALS — BP 114/70 | Ht 63.0 in | Wt 144.4 lb

## 2020-11-13 DIAGNOSIS — Z3480 Encounter for supervision of other normal pregnancy, unspecified trimester: Secondary | ICD-10-CM | POA: Insufficient documentation

## 2020-11-13 DIAGNOSIS — O3441 Maternal care for other abnormalities of cervix, first trimester: Secondary | ICD-10-CM | POA: Diagnosis not present

## 2020-11-13 DIAGNOSIS — R87610 Atypical squamous cells of undetermined significance on cytologic smear of cervix (ASC-US): Secondary | ICD-10-CM | POA: Insufficient documentation

## 2020-11-13 DIAGNOSIS — Z3401 Encounter for supervision of normal first pregnancy, first trimester: Secondary | ICD-10-CM | POA: Insufficient documentation

## 2020-11-13 DIAGNOSIS — Z124 Encounter for screening for malignant neoplasm of cervix: Secondary | ICD-10-CM

## 2020-11-13 DIAGNOSIS — N912 Amenorrhea, unspecified: Secondary | ICD-10-CM

## 2020-11-13 DIAGNOSIS — Z3490 Encounter for supervision of normal pregnancy, unspecified, unspecified trimester: Secondary | ICD-10-CM | POA: Insufficient documentation

## 2020-11-13 DIAGNOSIS — O0990 Supervision of high risk pregnancy, unspecified, unspecified trimester: Secondary | ICD-10-CM

## 2020-11-13 DIAGNOSIS — Z3A01 Less than 8 weeks gestation of pregnancy: Secondary | ICD-10-CM | POA: Insufficient documentation

## 2020-11-13 DIAGNOSIS — Z3481 Encounter for supervision of other normal pregnancy, first trimester: Secondary | ICD-10-CM | POA: Diagnosis present

## 2020-11-13 LAB — POCT URINALYSIS DIPSTICK OB
Glucose, UA: NEGATIVE
POC,PROTEIN,UA: NEGATIVE

## 2020-11-13 LAB — OB RESULTS CONSOLE GC/CHLAMYDIA
Chlamydia: NEGATIVE
Gonorrhea: NEGATIVE

## 2020-11-13 NOTE — Progress Notes (Signed)
New Obstetric Patient H&P    Chief Complaint: "Desires prenatal care"   History of Present Illness: Patient is a 25 y.o. G1P0 Not Hispanic or Latino female, LMP unknown presents with amenorrhea and positive home pregnancy test. Based on her  LMP, her EDD is Estimated Date of Delivery: 07/14/21 and her EGA is [redacted]w[redacted]d. Cycles are 5. days, regular, and occur approximately every : 28 days. Her last pap smear was NA . She has never had a pap smear.   She had a urine pregnancy test which was positive about 7 day(s)  ago. Her last menstrual period was normal and lasted for  about 5 day(s). Since her LMP she claims she has experienced nausea, breast tenderness. She denies vaginal bleeding. Her past medical history is noncontributory. Her prior pregnancies are notable for none  Since her LMP, she admits to the use of tobacco products  Yes . She is a cigarette user and smokes about half ppd. I stry8ing to cut back. She claims she has gained   no pounds since the start of her pregnancy.  There are cats in the home in the home  yes If yes Outdoor She admits close contact with children on a regular basis  no  She has had chicken pox in the past yes She has had Tuberculosis exposures, symptoms, or previously tested positive for TB   no Current or past history of domestic violence. no  Genetic Screening/Teratology Counseling: (Includes patient, baby's father, or anyone in either family with:)   1. Patient's age >/= 21 at [facility]  no 2. Thalassemia ([place], [place], Mediterranean, or Asian background): MCV<80  no 3. Neural tube defect (meningomyelocele, spina bifida, anencephaly)  no 4. Congenital heart defect  no  5. Down syndrome  no 6. Tay-Sachs (Jewish, [place]  no 7. Canavan's Disease  no 8. Sickle cell disease or trait (African)  no  9. Hemophilia or other blood disorders  no  10. Muscular dystrophy  no  11. Cystic fibrosis  no  12. Huntington's Chorea  no  13. Mental  retardation/autism  no 14. Other inherited genetic or chromosomal disorder  no 15. Maternal metabolic disorder (DM, PKU, etc)  no 16. Patient or FOB with a child with a birth defect not listed above no  16a. Patient or FOB with a birth defect themselves no 17. Recurrent pregnancy loss, or stillbirth  no  18. Any medications since LMP other than prenatal vitamins (include vitamins, supplements, OTC meds, drugs, alcohol)  no 19. Any other genetic/environmental exposure to discuss  no  Infection History:   1. Lives with someone with TB or TB exposed  no  2. Patient or partner has history of genital herpes  no 3. Rash or viral illness since LMP  no 4. History of STI (GC, CT, HPV, syphilis, HIV)  no 5. History of recent travel :  no  Other pertinent information:  yes . She had 2 weeks of inpatient treatment secondary to a drug OD in April of 2021, and has not followed up with counseling or medication or psych treatment since then. PHQ today is 9, GAD is 11.    Review of Systems:10 point review of systems negative unless otherwise noted in HPI  Past Medical History:  Past Medical History:  Diagnosis Date  . Seizures (HCC)     Past Surgical History:  Past Surgical History:  Procedure Laterality Date  . EYE SURGERY      Gynecologic History:  Patient's last menstrual period was 10/07/2020 (approximate).  Obstetric History: G1P0  Family History:  Family History  Problem Relation Age of Onset  . Hypertension Father     Social History:  Social History   Socioeconomic History  . Marital status: Single    Spouse name: Not on file  . Number of children: Not on file  . Years of education: Not on file  . Highest education level: Not on file  Occupational History  . Not on file  Tobacco Use  . Smoking status: Current Every Day Smoker    Packs/day: 0.50    Types: Cigarettes  . Smokeless tobacco: Never Used  Vaping Use  . Vaping Use: Never used  Substance and Sexual  Activity  . Alcohol use: Yes    Comment: occasionally  . Drug use: No  . Sexual activity: Not on file  Other Topics Concern  . Not on file  Social History Narrative  . Not on file   Social Determinants of Health   Financial Resource Strain: Not on file  Food Insecurity: Not on file  Transportation Needs: Not on file  Physical Activity: Not on file  Stress: Not on file  Social Connections: Not on file  Intimate Partner Violence: Not on file    Allergies:  No Known Allergies  Medications: Prior to Admission medications   Medication Sig Start Date End Date Taking? Authorizing Provider  benzonatate (TESSALON PERLES) 100 MG capsule Take 1 capsule (100 mg total) by mouth 3 (three) times daily as needed for cough. Patient not taking: Reported on 11/13/2020 10/04/19   Renford Dills, NP  chlorpheniramine-HYDROcodone [facility] ER) 10-8 MG/5ML SUER Take 5 mLs by mouth at bedtime as needed for cough. do not drive or operate machinery while taking as can cause drowsiness. Patient not taking: Reported on 11/13/2020 10/04/19   Renford Dills, NP  doxycycline (VIBRAMYCIN) 100 MG capsule Take 1 capsule (100 mg total) by mouth 2 (two) times daily. Patient not taking: Reported on 11/13/2020 10/04/19   Renford Dills, NP  levonorgestrel-ethinyl estradiol [facility] Mane) 0.1-20 MG-MCG tablet Take by mouth. 03/10/18 10/04/19  [provider]  venlafaxine (EFFEXOR) 37.5 MG tablet Take 37.5 mg by mouth daily. 11/16/18 06/12/19  [provider]    Physical Exam Vitals: Blood pressure 114/70, height 5\' 3"  (1.6 m), weight 144 lb 6.4 oz (65.5 kg), last menstrual period 10/07/2020.  General: NAD HEENT: normocephalic, anicteric Thyroid: no enlargement, no palpable nodules Pulmonary: No increased work of breathing, CTAB Cardiovascular: RRR, distal pulses 2+ Abdomen: NABS, soft, non-tender, non-distended.  Umbilicus without lesions.  No hepatomegaly, splenomegaly or masses  palpable. No evidence of hernia  Genitourinary:  External: Normal external female genitalia.  Normal urethral meatus, normal  Bartholin's and Skene's glands.    Vagina: Normal vaginal mucosa, no evidence of prolapse.    Cervix: Grossly normal in appearance, no bleeding  Uterus: anteverted Non-enlarged, mobile, normal contour.  No CMT  Pelvimetry- her diagonal conjugate is less than 11cms  Adnexa: ovaries non-enlarged, no adnexal masses  Rectal: deferred Extremities: no edema, erythema, or tenderness Neurologic: Grossly intact Psychiatric: mood appropriate, affect full   Assessment: 25 y.o. G1P0 at [redacted]w[redacted]d presenting to initiate prenatal care  Plan: 1) Avoid alcoholic beverages. 2) Patient encouraged not to smoke.  3) Discontinue the use of all non-medicinal drugs and chemicals.  4) Take prenatal vitamins daily.  5) Nutrition, food safety (fish, cheese advisories, and high nitrite foods) and exercise discussed. 6) Hospital and practice style discussed with  cross coverage system.  7) Genetic Screening, such as with 1st Trimester Screening, cell free fetal DNA, AFP testing, and Ultrasound, as well as with amniocentesis and CVS as appropriate, is discussed with patient. At the conclusion of today's visit patient undecided genetic testing 8) Patient is asked about travel to areas at risk for the [place] virus, and counseled to avoid travel and exposure to mosquitoes or sexual partners who may have themselves been exposed to the virus. Testing is discussed, and will be ordered as appropriate.   Breastfeeding discussed, benefits of and importance of education for preparation addressed. Pap smear and cultures done today. RTC in 4 weeks for labs, sono for dating and ROB. Referral to Social Work made today.  Mirna Mires, CNM  11/13/2020 3:42 PM

## 2020-11-14 LAB — URINE DRUG PANEL 7
Amphetamines, Urine: NEGATIVE ng/mL
Barbiturate Quant, Ur: NEGATIVE ng/mL
Benzodiazepine Quant, Ur: NEGATIVE ng/mL
Cannabinoid Quant, Ur: NEGATIVE ng/mL
Cocaine (Metab.): NEGATIVE ng/mL
Opiate Quant, Ur: NEGATIVE ng/mL
PCP Quant, Ur: NEGATIVE ng/mL

## 2020-11-15 LAB — URINE CULTURE

## 2020-11-19 LAB — CYTOLOGY - PAP
Chlamydia: NEGATIVE
Comment: NEGATIVE
Comment: NEGATIVE
Comment: NEGATIVE
Comment: NORMAL
Diagnosis: UNDETERMINED — AB
High risk HPV: NEGATIVE
Neisseria Gonorrhea: NEGATIVE
Trichomonas: NEGATIVE

## 2020-11-20 ENCOUNTER — Telehealth: Payer: Self-pay | Admitting: Licensed Clinical Social Worker

## 2020-11-20 NOTE — Telephone Encounter (Signed)
-----   Message from [facility] sent at 11/20/2020 11:21 AM EST ----- Regarding: Referral Good morning Marchelle Folks,  I have a client who is interested in starting counseling for depression specifically (she does also have diagnosis of anxiety but depression symptoms are her main concern at the moment).  I attached her chart. The best # to reach her at is [phone]  Thanks!  Okey Dupre

## 2020-12-11 ENCOUNTER — Ambulatory Visit: Payer: BC Managed Care – PPO

## 2020-12-11 ENCOUNTER — Encounter: Payer: BC Managed Care – PPO | Admitting: Obstetrics and Gynecology

## 2020-12-11 DIAGNOSIS — Z3480 Encounter for supervision of other normal pregnancy, unspecified trimester: Secondary | ICD-10-CM

## 2020-12-11 DIAGNOSIS — N912 Amenorrhea, unspecified: Secondary | ICD-10-CM

## 2020-12-19 ENCOUNTER — Other Ambulatory Visit: Payer: Self-pay

## 2020-12-19 ENCOUNTER — Ambulatory Visit
Admission: RE | Admit: 2020-12-19 | Discharge: 2020-12-19 | Disposition: A | Discharge status: Home / Self Care | Payer: BC Managed Care – PPO | Source: Ambulatory Visit | Attending: Obstetrics | Admitting: Obstetrics

## 2020-12-19 ENCOUNTER — Other Ambulatory Visit: Payer: Self-pay | Admitting: Obstetrics

## 2020-12-19 DIAGNOSIS — N912 Amenorrhea, unspecified: Secondary | ICD-10-CM | POA: Insufficient documentation

## 2020-12-19 DIAGNOSIS — Z3480 Encounter for supervision of other normal pregnancy, unspecified trimester: Secondary | ICD-10-CM | POA: Diagnosis not present

## 2020-12-23 ENCOUNTER — Other Ambulatory Visit: Payer: Self-pay

## 2020-12-23 ENCOUNTER — Ambulatory Visit (INDEPENDENT_AMBULATORY_CARE_PROVIDER_SITE_OTHER): Payer: BC Managed Care – PPO | Admitting: Obstetrics and Gynecology

## 2020-12-23 VITALS — BP 112/68 | Wt 151.0 lb

## 2020-12-23 DIAGNOSIS — Z369 Encounter for antenatal screening, unspecified: Secondary | ICD-10-CM

## 2020-12-23 DIAGNOSIS — Z363 Encounter for antenatal screening for malformations: Secondary | ICD-10-CM

## 2020-12-23 DIAGNOSIS — Z3A14 14 weeks gestation of pregnancy: Secondary | ICD-10-CM

## 2020-12-23 DIAGNOSIS — O0990 Supervision of high risk pregnancy, unspecified, unspecified trimester: Secondary | ICD-10-CM

## 2020-12-23 DIAGNOSIS — Z31438 Encounter for other genetic testing of female for procreative management: Secondary | ICD-10-CM

## 2020-12-23 DIAGNOSIS — Z3401 Encounter for supervision of normal first pregnancy, first trimester: Secondary | ICD-10-CM

## 2020-12-23 DIAGNOSIS — Z113 Encounter for screening for infections with a predominantly sexual mode of transmission: Secondary | ICD-10-CM

## 2020-12-23 LAB — OB RESULTS CONSOLE VARICELLA ZOSTER ANTIBODY, IGG: Varicella: IMMUNE

## 2020-12-23 NOTE — Progress Notes (Signed)
Routine Prenatal Care Visit  Subjective  Jacqueline Krueger is a 25 y.o. G1P0 at [redacted]w[redacted]d being seen today for ongoing prenatal care.  She is currently monitored for the following issues for this low-risk pregnancy and has Encounter for supervision of normal first pregnancy in first trimester on their problem list.  ----------------------------------------------------------------------------------- Patient reports no complaints.   Contractions: Not present. Vag. Bleeding: None.  Movement: Absent. Denies leaking of fluid.  ----------------------------------------------------------------------------------- The following portions of the patient's history were reviewed and updated as appropriate: allergies, current medications, past family history, past medical history, past social history, past surgical history and problem list. Problem list updated.   Objective  Blood pressure 112/68, weight 151 lb (68.5 kg), last menstrual period 10/07/2020. Pregravid weight Pregravid weight not on file Total Weight Gain Not found. Urinalysis:      Fetal Status: Fetal Heart Rate (bpm): 170   Movement: Absent     General:  Alert, oriented and cooperative. Patient is in no acute distress.  Skin: Skin is warm and dry. No rash noted.   Cardiovascular: Normal heart rate noted  Respiratory: Normal respiratory effort, no problems with respiration noted  Abdomen: Soft, gravid, appropriate for gestational age. Pain/Pressure: Absent     Pelvic:  Cervical exam deferred        Extremities: Normal range of motion.     ental Status: Normal mood and affect. Normal behavior. Normal judgment and thought content.     Assessment   25 y.o. G1P0 at [redacted]w[redacted]d by Estimated Date of Delivery: 06/23/21 , by Last Menstrual Period presenting for routine prenatal visit  Plan   pregnancy 1  Problems (from 11/13/20 to present)    Problem Noted Resolved   Supervision of high risk pregnancy, antepartum 11/13/2020 by Mirna Mires,  CNM No   Overview Addendum 11/19/2020  1:35 PM by Mirna Mires, CNM     Clinic  Prenatal Labs  Dating  Blood type:     Genetic Screen 1 Screen:    AFP:     Quad:     NIPS: Antibody:   Anatomic [place]  Rubella:    GTT Early:               Third trimester:  RPR:     Flu vaccine  HBsAg:     TDaP vaccine                                               Rhogam: HIV:     Baby Food                                               GBS: (For PCN allergy, check sensitivities)  Contraception  Pap:  ASCUS  Circumcision    Pediatrician    Support Person     Unplanned preg Tobacco use Hx of drug OD April 2021 with two weeks inpatient tx and no follow up Referred to social Work         Previous Version       Gestational age appropriate obstetric precautions including but not limited to vaginal bleeding, contractions, leaking of fluid and fetal movement were reviewed in detail with the patient.    - NOB labs -  declines trisomy screening - accepts carrier screening  Return in about 4 weeks (around 01/20/2021) for 4 week ROB, 6 week ROB and anatomy scan.  Vena [place], MD, [facility], [facility] 12/23/2020, 1:52 PM

## 2020-12-24 LAB — RPR+RH+ABO+RUB AB+AB SCR+CB...
Antibody Screen: NEGATIVE
HIV Screen 4th Generation wRfx: NONREACTIVE
Hematocrit: 41.4 % (ref 34.0–46.6)
Hemoglobin: 14.3 g/dL (ref 11.1–15.9)
Hepatitis B Surface Ag: NEGATIVE
MCH: 30.8 pg (ref 26.6–33.0)
MCHC: 34.5 g/dL (ref 31.5–35.7)
MCV: 89 fL (ref 79–97)
Platelets: 334 10*3/uL (ref 150–450)
RBC: 4.65 x10E6/uL (ref 3.77–5.28)
RDW: 12.9 % (ref 11.7–15.4)
RPR Ser Ql: NONREACTIVE
Rh Factor: POSITIVE
Rubella Antibodies, IGG: 3.1 index (ref >0.99)
Varicella zoster IgG: 1087 index (ref >165)
WBC: 12.4 10*3/uL — ABNORMAL HIGH (ref 3.4–10.8)

## 2021-01-03 LAB — INHERITEST CORE(CF97,SMA,FRAX)

## 2021-01-06 ENCOUNTER — Ambulatory Visit (INDEPENDENT_AMBULATORY_CARE_PROVIDER_SITE_OTHER): Payer: BC Managed Care – PPO | Admitting: Advanced Practice Midwife

## 2021-01-06 ENCOUNTER — Other Ambulatory Visit: Payer: Self-pay

## 2021-01-06 ENCOUNTER — Encounter: Payer: Self-pay | Admitting: Advanced Practice Midwife

## 2021-01-06 VITALS — BP 110/60 | Wt 150.0 lb

## 2021-01-06 DIAGNOSIS — Z3A16 16 weeks gestation of pregnancy: Secondary | ICD-10-CM

## 2021-01-06 DIAGNOSIS — F419 Anxiety disorder, unspecified: Secondary | ICD-10-CM | POA: Insufficient documentation

## 2021-01-06 DIAGNOSIS — Z3402 Encounter for supervision of normal first pregnancy, second trimester: Secondary | ICD-10-CM

## 2021-01-06 LAB — POCT URINALYSIS DIPSTICK OB
Glucose, UA: NEGATIVE
POC,PROTEIN,UA: NEGATIVE

## 2021-01-06 NOTE — Progress Notes (Signed)
Routine Prenatal Care Visit  Subjective  Jacqueline Krueger is a 25 y.o. G1P0 at [redacted]w[redacted]d being seen today for ongoing prenatal care.  She is currently monitored for the following issues for this low-risk pregnancy and has Encounter for supervision of normal first pregnancy in first trimester; Anxiety; Depression; Seizure disorder (HCC); and Overdose, intentional self-harm, initial encounter [facility]) on their problem list.  ----------------------------------------------------------------------------------- Patient reports no complaints.   Contractions: Not present. Vag. Bleeding: None.  Movement: Absent. Leaking Fluid denies.  ----------------------------------------------------------------------------------- The following portions of the patient's history were reviewed and updated as appropriate: allergies, current medications, past family history, past medical history, past social history, past surgical history and problem list. Problem list updated.  Objective  Blood pressure 110/60, weight 150 lb (68 kg), last menstrual period 10/07/2020. Pregravid weight 140 lb (63.5 kg) Total Weight Gain 10 lb (4.536 kg) Urinalysis: Urine Protein    Urine Glucose    Fetal Status: Fetal Heart Rate (bpm): 156   Movement: Absent     General:  Alert, oriented and cooperative. Patient is in no acute distress.  Skin: Skin is warm and dry. No rash noted.   Cardiovascular: Normal heart rate noted  Respiratory: Normal respiratory effort, no problems with respiration noted  Abdomen: Soft, gravid, appropriate for gestational age. Pain/Pressure: Absent     Pelvic:  Cervical exam deferred        Extremities: Normal range of motion.  Edema: None  Mental Status: Normal mood and affect. Normal behavior. Normal judgment and thought content.   Assessment   25 y.o. G1P0 at [redacted]w[redacted]d by  06/23/2021, by Ultrasound presenting for routine prenatal visit  Plan   pregnancy 1  Problems (from 11/13/20 to present)    Problem Noted  Resolved   Encounter for supervision of normal first pregnancy in first trimester 11/13/2020 by Mirna Mires, CNM No   Overview Addendum 11/19/2020  1:35 PM by Mirna Mires, CNM     Clinic  Prenatal Labs  Dating  Blood type:     Genetic Screen 1 Screen:    AFP:     Quad:     NIPS: Antibody:   Anatomic [place]  Rubella:    GTT Early:               Third trimester:  RPR:     Flu vaccine  HBsAg:     TDaP vaccine                                               Rhogam: HIV:     Baby Food                                               GBS: (For PCN allergy, check sensitivities)  Contraception  Pap:  ASCUS  Circumcision    Pediatrician    Support Person     Unplanned preg Tobacco use Hx of drug OD April 2021 with two weeks inpatient tx and no follow up Referred to social Work         Previous Version       Preterm labor symptoms and general obstetric precautions including but not limited to vaginal bleeding, contractions, leaking of fluid and fetal movement were reviewed  in detail with the patient.   Return for scheduled prenatal visit.  Tresea Mall, CNM 01/06/2021 1:40 PM

## 2021-01-06 NOTE — Addendum Note (Signed)
Addended by: Cornelius Moras D on: 01/06/2021 01:45 PM   Modules accepted: Orders

## 2021-02-05 ENCOUNTER — Other Ambulatory Visit: Payer: Self-pay

## 2021-02-05 ENCOUNTER — Ambulatory Visit (INDEPENDENT_AMBULATORY_CARE_PROVIDER_SITE_OTHER): Payer: BC Managed Care – PPO | Admitting: Obstetrics and Gynecology

## 2021-02-05 ENCOUNTER — Encounter: Payer: Self-pay | Admitting: Obstetrics and Gynecology

## 2021-02-05 VITALS — BP 112/70 | Ht 63.0 in | Wt 153.0 lb

## 2021-02-05 DIAGNOSIS — Z3A20 20 weeks gestation of pregnancy: Secondary | ICD-10-CM

## 2021-02-05 DIAGNOSIS — Z3402 Encounter for supervision of normal first pregnancy, second trimester: Secondary | ICD-10-CM

## 2021-02-05 DIAGNOSIS — Z3401 Encounter for supervision of normal first pregnancy, first trimester: Secondary | ICD-10-CM

## 2021-02-05 NOTE — Patient Instructions (Addendum)
Magnesium 500 mg daily B2 400 mg daily Coenzyme q10 150 mg daily Ferrous Sulfate 65 mg elemental iron daily   Migraine Headache A migraine headache is an intense, throbbing pain on one side or both sides of the head. Migraine headaches may also cause other symptoms, such as nausea, vomiting, and sensitivity to light and noise. A migraine headache can last from 4 hours to 3 days. Talk with your doctor about what things may bring on (trigger) your migraine headaches. What are the causes? The exact cause of this condition is not known. However, a migraine may be caused when nerves in the brain become irritated and release chemicals that cause inflammation of blood vessels. This inflammation causes pain. This condition may be triggered or caused by:  Drinking alcohol.  Smoking.  Taking medicines, such as: ? Medicine used to treat chest pain (nitroglycerin). ? Birth control pills. ? Estrogen. ? Certain blood pressure medicines.  Eating or drinking products that contain nitrates, glutamate, aspartame, or tyramine. Aged cheeses, chocolate, or caffeine may also be triggers.  Doing physical activity. Other things that may trigger a migraine headache include:  Menstruation.  Pregnancy.  Hunger.  Stress.  Lack of sleep or too much sleep.  Weather changes.  Fatigue. What increases the risk? The following factors may make you more likely to experience migraine headaches:  Being a certain age. This condition is more common in people who are 725-278 years old.  Being female.  Having a family history of migraine headaches.  Being Caucasian.  Having a mental health condition, such as depression or anxiety.  Being obese. What are the signs or symptoms? The main symptom of this condition is pulsating or throbbing pain. This pain may:  Happen in any area of the head, such as on one side or both sides.  Interfere with daily activities.  Get worse with physical activity.  Get  worse with exposure to bright lights or loud noises. Other symptoms may include:  Nausea.  Vomiting.  Dizziness.  General sensitivity to bright lights, loud noises, or smells. Before you get a migraine headache, you may get warning signs (an aura). An aura may include:  Seeing flashing lights or having blind spots.  Seeing bright spots, halos, or zigzag lines.  Having tunnel vision or blurred vision.  Having numbness or a tingling feeling.  Having trouble talking.  Having muscle weakness. Some people have symptoms after a migraine headache (postdromal phase), such as:  Feeling tired.  Difficulty concentrating. How is this diagnosed? A migraine headache can be diagnosed based on:  Your symptoms.  A physical exam.  Tests, such as: ? CT scan or an MRI of the head. These imaging tests can help rule out other causes of headaches. ? Taking fluid from the spine (lumbar puncture) and analyzing it (cerebrospinal fluid analysis, or CSF analysis). How is this treated? This condition may be treated with medicines that:  Relieve pain.  Relieve nausea.  Prevent migraine headaches. Treatment for this condition may also include:  Acupuncture.  Lifestyle changes like avoiding foods that trigger migraine headaches.  Biofeedback.  Cognitive behavioral therapy. Follow these instructions at home: Medicines  Take over-the-counter and prescription medicines only as told by your health care provider.  Ask your health care provider if the medicine prescribed to you: ? Requires you to avoid driving or using heavy machinery. ? Can cause constipation. You may need to take these actions to prevent or treat constipation:  Drink enough fluid to keep your urine  pale yellow.  Take over-the-counter or prescription medicines.  Eat foods that are high in fiber, such as beans, whole grains, and fresh fruits and vegetables.  Limit foods that are high in fat and processed sugars, such  as fried or sweet foods. Lifestyle  Do not drink alcohol.  Do not use any products that contain nicotine or tobacco, such as cigarettes, e-cigarettes, and chewing tobacco. If you need help quitting, ask your health care provider.  Get at least 8 hours of sleep every night.  Find ways to manage stress, such as meditation, deep breathing, or yoga. General instructions  Keep a journal to find out what may trigger your migraine headaches. For example, write down: ? What you eat and drink. ? How much sleep you get. ? Any change to your diet or medicines.  If you have a migraine headache: ? Avoid things that make your symptoms worse, such as bright lights. ? It may help to lie down in a dark, quiet room. ? Do not drive or use heavy machinery. ? Ask your health care provider what activities are safe for you while you are experiencing symptoms.  Keep all follow-up visits as told by your health care provider. This is important.      Contact a health care provider if:  You develop symptoms that are different or more severe than your usual migraine headache symptoms.  You have more than 15 headache days in one month. Get help right away if:  Your migraine headache becomes severe.  Your migraine headache lasts longer than 72 hours.  You have a fever.  You have a stiff neck.  You have vision loss.  Your muscles feel weak or like you cannot control them.  You start to lose your balance often.  You have trouble walking.  You faint.  You have a seizure. Summary  A migraine headache is an intense, throbbing pain on one side or both sides of the head. Migraines may also cause other symptoms, such as nausea, vomiting, and sensitivity to light and noise.  This condition may be treated with medicines and lifestyle changes. You may also need to avoid certain things that trigger a migraine headache.  Keep a journal to find out what may trigger your migraine headaches.  Contact  your health care provider if you have more than 15 headache days in a month or you develop symptoms that are different or more severe than your usual migraine headache symptoms. This information is not intended to replace advice given to you by your health care provider. Make sure you discuss any questions you have with your health care provider. Document Revised: 12/23/2018 Document Reviewed: 10/13/2018 Elsevier Patient Education  2021 Elsevier Inc.   Second Trimester of Pregnancy  The second trimester of pregnancy is from week 13 through week 27. This is months 4 through 6 of pregnancy. The second trimester is often a time when you feel your best. Your body has adjusted to being pregnant, and you begin to feel better physically. During the second trimester:  Morning sickness has lessened or stopped completely.  You may have more energy.  You may have an increase in appetite. The second trimester is also a time when the unborn baby (fetus) is growing rapidly. At the end of the sixth month, the fetus may be up to 12 inches long and weigh about 1 pounds. You will likely begin to feel the baby move (quickening) between 16 and 20 weeks of pregnancy. Body changes during  your second trimester Your body continues to go through many changes during your second trimester. The changes vary and generally return to normal after the baby is born. Physical changes  Your weight will continue to increase. You will notice your lower abdomen bulging out.  You may begin to get stretch marks on your hips, abdomen, and breasts.  Your breasts will continue to grow and to become tender.  Dark spots or blotches (chloasma or mask of pregnancy) may develop on your face.  A dark line from your belly button to the pubic area (linea nigra) may appear.  You may have changes in your hair. These can include thickening of your hair, rapid growth, and changes in texture. Some people also have hair loss during or  after pregnancy, or hair that feels dry or thin. Health changes  You may develop headaches.  You may have heartburn.  You may develop constipation.  You may develop hemorrhoids or swollen, bulging veins (varicose veins).  Your gums may bleed and may be sensitive to brushing and flossing.  You may urinate more often because the fetus is pressing on your bladder.  You may have back pain. This is caused by: ? Weight gain. ? Pregnancy hormones that are relaxing the joints in your pelvis. ? A shift in weight and the muscles that support your balance. Follow these instructions at home: Medicines  Follow your health care provider's instructions regarding medicine use. Specific medicines may be either safe or unsafe to take during pregnancy. Do not take any medicines unless approved by your health care provider.  Take a prenatal vitamin that contains at least 600 micrograms (mcg) of folic acid. Eating and drinking  Eat a healthy diet that includes fresh fruits and vegetables, whole grains, good sources of protein such as meat, eggs, or tofu, and low-fat dairy products.  Avoid raw meat and unpasteurized juice, milk, and cheese. These carry germs that can harm you and your baby.  You may need to take these actions to prevent or treat constipation: ? Drink enough fluid to keep your urine pale yellow. ? Eat foods that are high in fiber, such as beans, whole grains, and fresh fruits and vegetables. ? Limit foods that are high in fat and processed sugars, such as fried or sweet foods. Activity  Exercise only as directed by your health care provider. Most people can continue their usual exercise routine during pregnancy. Try to exercise for 30 minutes at least 5 days a week. Stop exercising if you develop contractions in your uterus.  Stop exercising if you develop pain or cramping in the lower abdomen or lower back.  Avoid exercising if it is very hot or humid or if you are at a high  altitude.  Avoid heavy lifting.  If you choose to, you may have sex unless your health care provider tells you not to. Relieving pain and discomfort  Wear a supportive bra to prevent discomfort from breast tenderness.  Take warm sitz baths to soothe any pain or discomfort caused by hemorrhoids. Use hemorrhoid cream if your health care provider approves.  Rest with your legs raised (elevated) if you have leg cramps or low back pain.  If you develop varicose veins: ? Wear support hose as told by your health care provider. ? Elevate your feet for 15 minutes, 3-4 times a day. ? Limit salt in your diet. Safety  Wear your seat belt at all times when driving or riding in a car.  Talk with  your health care provider if someone is verbally or physically abusive to you. Lifestyle  Do not use hot tubs, steam rooms, or saunas.  Do not douche. Do not use tampons or scented sanitary pads.  Avoid cat litter boxes and soil used by cats. These carry germs that can cause birth defects in the baby and possibly loss of the fetus by miscarriage or stillbirth.  Do not use herbal remedies, alcohol, illegal drugs, or medicines that are not approved by your health care provider. Chemicals in these products can harm your baby.  Do not use any products that contain nicotine or tobacco, such as cigarettes, e-cigarettes, and chewing tobacco. If you need help quitting, ask your health care provider. General instructions  During a routine prenatal visit, your health care provider will do a physical exam and other tests. He or she will also discuss your overall health. Keep all follow-up visits. This is important.  Ask your health care provider for a referral to a local prenatal education class.  Ask for help if you have counseling or nutritional needs during pregnancy. Your health care provider can offer advice or refer you to specialists for help with various needs. Where to find more  information  American Pregnancy Association: americanpregnancy.org  [employer]: https://www.todd-brady.net/  Office on [employer] [facility]: Might[employer].nz Contact a health care provider if you have:  A headache that does not go away when you take medicine.  Vision changes or you see spots in front of your eyes.  Mild pelvic cramps, pelvic pressure, or nagging pain in the abdominal area.  Persistent nausea, vomiting, or diarrhea.  A bad-smelling vaginal discharge or foul-smelling urine.  Pain when you urinate.  Sudden or extreme swelling of your face, hands, ankles, feet, or legs.  A fever. Get help right away if you:  Have fluid leaking from your vagina.  Have spotting or bleeding from your vagina.  Have severe abdominal cramping or pain.  Have difficulty breathing.  Have chest pain.  Have fainting spells.  Have not felt your baby move for the time period told by your health care provider.  Have new or increased pain, swelling, or redness in an arm or leg. Summary  The second trimester of pregnancy is from week 13 through week 27 (months 4 through 6).  Do not use herbal remedies, alcohol, illegal drugs, or medicines that are not approved by your health care provider. Chemicals in these products can harm your baby.  Exercise only as directed by your health care provider. Most people can continue their usual exercise routine during pregnancy.  Keep all follow-up visits. This is important. This information is not intended to replace advice given to you by your health care provider. Make sure you discuss any questions you have with your health care provider. Document Revised: 02/07/2020 Document Reviewed: 12/14/2019 Elsevier Patient Education  2021 [employer].

## 2021-02-05 NOTE — Progress Notes (Signed)
Routine Prenatal Care Visit  Subjective  Jacqueline Krueger is a 25 y.o. G1P0 at [redacted]w[redacted]d being seen today for ongoing prenatal care.  She is currently monitored for the following issues for this low-risk pregnancy and has Encounter for supervision of normal first pregnancy in first trimester; Anxiety; Depression; Seizure disorder (HCC); and Overdose, intentional self-harm, initial encounter [facility]) on their problem list.  ----------------------------------------------------------------------------------- Patient reports headache. She is having headaches and dizziness 4 times a week for the last 3 weeks.  Reports the headache can last for up to 8 hours. She is sensitive to light and noise. Tylenol can resolve the headaches but she does not like to take that.  Denies auras.  Contractions: Not present. Vag. Bleeding: None.  Movement: Present. Denies leaking of fluid.  ----------------------------------------------------------------------------------- The following portions of the patient's history were reviewed and updated as appropriate: allergies, current medications, past family history, past medical history, past social history, past surgical history and problem list. Problem list updated.   Objective  Blood pressure 112/70, height 5\' 3"  (1.6 m), weight 153 lb (69.4 kg), last menstrual period 10/07/2020. Pregravid weight 140 lb (63.5 kg) Total Weight Gain 13 lb (5.897 kg) Urinalysis:      Fetal Status: Fetal Heart Rate (bpm): 145 Fundal Height: 20 cm Movement: Present     General:  Alert, oriented and cooperative. Patient is in no acute distress.  Skin: Skin is warm and dry. No rash noted.   Cardiovascular: Normal heart rate noted  Respiratory: Normal respiratory effort, no problems with respiration noted  Abdomen: Soft, gravid, appropriate for gestational age. Pain/Pressure: Absent     Pelvic:  Cervical exam deferred        Extremities: Normal range of motion.  Edema: None  Mental Status:  Normal mood and affect. Normal behavior. Normal judgment and thought content.     Assessment   25 y.o. G1P0 at [redacted]w[redacted]d by  06/23/2021, by Ultrasound presenting for routine prenatal visit  Plan   pregnancy 1  Problems (from 11/13/20 to present)    Problem Noted Resolved   Encounter for supervision of normal first pregnancy in first trimester 11/13/2020 by 01/13/2021, CNM No   Overview Addendum 02/05/2021  2:16 PM by 02/07/2021, MD     Nursing Staff Provider  Office Location  [facility] Dating   13 wk Natale Milch  Language  English Anatomy US    Flu Vaccine   Genetic Screen  Fragile-X neg, CF neg, SMA neg  NIPS: declines  TDaP vaccine    Hgb A1C or  GTT Early : Third trimester :   Covid    LAB RESULTS   Rhogam  Not needed Blood Type A/Positive/-- (04/11 1401)   Feeding Plan  Antibody Negative (04/11 1401)  Contraception  Rubella 3.10 (04/11 1401)  Circumcision  RPR Non Reactive (04/11 1401)   Pediatrician   HBsAg Negative (04/11 1401)   Support Person  HIV Non Reactive (04/11 1401)  Prenatal Classes  Varicella     GBS  (For PCN allergy, check sensitivities)   BTL Consent     VBAC Consent  Pap  2022 ASCUS HPV neg    Hgb Electro      CF      SMA          Unplanned preg Tobacco use Hx of drug OD April 2021 with two weeks inpatient tx and no follow up Referred to social Work         Previous Version  Reviewed supplements for migraines Advised to take tylenol early at headache onset- 1000 mg every 6 hours as needed.  Reports she has spoken to [facility] through [facility].   Gestational age appropriate obstetric precautions including but not limited to vaginal bleeding, contractions, leaking of fluid and fetal movement were reviewed in detail with the patient.    Return in about 4 weeks (around 03/05/2021) for ROB in person.  Natale Milch MD [facility], [facility] 02/05/2021, 2:16 PM

## 2021-02-24 ENCOUNTER — Ambulatory Visit
Admission: RE | Admit: 2021-02-24 | Discharge: 2021-02-24 | Disposition: A | Discharge status: Home / Self Care | Payer: BC Managed Care – PPO | Source: Ambulatory Visit | Attending: Obstetrics and Gynecology | Admitting: Obstetrics and Gynecology

## 2021-02-24 ENCOUNTER — Other Ambulatory Visit: Payer: Self-pay

## 2021-02-24 DIAGNOSIS — Z363 Encounter for antenatal screening for malformations: Secondary | ICD-10-CM | POA: Diagnosis not present

## 2021-02-24 DIAGNOSIS — Z3401 Encounter for supervision of normal first pregnancy, first trimester: Secondary | ICD-10-CM | POA: Diagnosis present

## 2021-02-25 ENCOUNTER — Ambulatory Visit (INDEPENDENT_AMBULATORY_CARE_PROVIDER_SITE_OTHER): Payer: BC Managed Care – PPO | Admitting: Obstetrics and Gynecology

## 2021-02-25 VITALS — BP 112/66 | Wt 159.0 lb

## 2021-02-25 DIAGNOSIS — Z369 Encounter for antenatal screening, unspecified: Secondary | ICD-10-CM

## 2021-02-25 DIAGNOSIS — Z3400 Encounter for supervision of normal first pregnancy, unspecified trimester: Secondary | ICD-10-CM

## 2021-02-25 DIAGNOSIS — Z362 Encounter for other antenatal screening follow-up: Secondary | ICD-10-CM

## 2021-02-25 DIAGNOSIS — Z3402 Encounter for supervision of normal first pregnancy, second trimester: Secondary | ICD-10-CM

## 2021-02-25 DIAGNOSIS — Z3A23 23 weeks gestation of pregnancy: Secondary | ICD-10-CM

## 2021-02-25 LAB — POCT URINALYSIS DIPSTICK OB
Glucose, UA: NEGATIVE
POC,PROTEIN,UA: NEGATIVE

## 2021-02-25 NOTE — Progress Notes (Signed)
Routine Prenatal Care Visit  Subjective  Jacqueline Krueger is a 25 y.o. G1P0 at [redacted]w[redacted]d being seen today for ongoing prenatal care.  She is currently monitored for the following issues for this low-risk pregnancy and has Encounter for supervision of normal first pregnancy in first trimester; Anxiety; Depression; Seizure disorder (HCC); and Overdose, intentional self-harm, initial encounter [facility]) on their problem list.  ----------------------------------------------------------------------------------- Patient reports no complaints.   Contractions: Not present. Vag. Bleeding: None.  Movement: Present. Denies leaking of fluid.  ----------------------------------------------------------------------------------- The following portions of the patient's history were reviewed and updated as appropriate: allergies, current medications, past family history, past medical history, past social history, past surgical history and problem list. Problem list updated.   Objective  Blood pressure 112/66, weight 159 lb (72.1 kg), last menstrual period 10/07/2020. Pregravid weight 140 lb (63.5 kg) Total Weight Gain 19 lb (8.618 kg) Urinalysis:      Fetal Status: Fetal Heart Rate (bpm): 140 Fundal Height: 23 cm Movement: Present     General:  Alert, oriented and cooperative. Patient is in no acute distress.  Skin: Skin is warm and dry. No rash noted.   Cardiovascular: Normal heart rate noted  Respiratory: Normal respiratory effort, no problems with respiration noted  Abdomen: Soft, gravid, appropriate for gestational age. Pain/Pressure: Absent     Pelvic:  Cervical exam deferred        Extremities: Normal range of motion.     ental Status: Normal mood and affect. Normal behavior. Normal judgment and thought content.     Assessment   25 y.o. G1P0 at [redacted]w[redacted]d by  06/23/2021, by Ultrasound presenting for routine prenatal visit  Plan   pregnancy 1  Problems (from 11/13/20 to present)     Problem Noted  Resolved   Encounter for supervision of normal first pregnancy in first trimester 11/13/2020 by Mirna Mires, CNM No   Overview Addendum 02/05/2021  2:16 PM by Natale Milch, MD     Nursing Staff Provider  Office Location  [facility] Dating   13 wk [place]  Language  English Anatomy US    Flu Vaccine   Genetic Screen  Fragile-X neg, CF neg, SMA neg  NIPS: declines  TDaP vaccine    Hgb A1C or  GTT Early : Third trimester :   Covid    LAB RESULTS   Rhogam  Not needed Blood Type A/Positive/-- (04/11 1401)   Feeding Plan  Antibody Negative (04/11 1401)  Contraception  Rubella 3.10 (04/11 1401)  Circumcision  RPR Non Reactive (04/11 1401)   Pediatrician   HBsAg Negative (04/11 1401)   Support Person  HIV Non Reactive (04/11 1401)  Prenatal Classes  Varicella     GBS  (For PCN allergy, check sensitivities)   BTL Consent     VBAC Consent  Pap  2022 ASCUS HPV neg    Hgb Electro      CF      SMA         Unplanned preg Tobacco use Hx of drug OD April 2021 with two weeks inpatient tx and no follow up Referred to social Work               Gestational age appropriate obstetric precautions including but not limited to vaginal bleeding, contractions, leaking of fluid and fetal movement were reviewed in detail with the patient.    Return in about 4 weeks (around 03/25/2021) for ROB and 28 week labs.  Vena Austria, MD, [place]  [facility], [facility] 02/25/2021, 2:09 PM

## 2021-02-25 NOTE — Progress Notes (Signed)
ROB - no concerns. RM 5 

## 2021-03-27 ENCOUNTER — Other Ambulatory Visit: Payer: BC Managed Care – PPO

## 2021-03-27 ENCOUNTER — Encounter: Payer: Self-pay | Admitting: Obstetrics and Gynecology

## 2021-03-27 ENCOUNTER — Ambulatory Visit (INDEPENDENT_AMBULATORY_CARE_PROVIDER_SITE_OTHER): Payer: BC Managed Care – PPO | Admitting: Obstetrics and Gynecology

## 2021-03-27 ENCOUNTER — Other Ambulatory Visit: Payer: Self-pay

## 2021-03-27 VITALS — BP 116/70 | Ht 63.0 in | Wt 163.4 lb

## 2021-03-27 DIAGNOSIS — Z369 Encounter for antenatal screening, unspecified: Secondary | ICD-10-CM

## 2021-03-27 DIAGNOSIS — Z3402 Encounter for supervision of normal first pregnancy, second trimester: Secondary | ICD-10-CM

## 2021-03-27 DIAGNOSIS — Z3400 Encounter for supervision of normal first pregnancy, unspecified trimester: Secondary | ICD-10-CM

## 2021-03-27 DIAGNOSIS — Z3A27 27 weeks gestation of pregnancy: Secondary | ICD-10-CM

## 2021-03-27 DIAGNOSIS — Z3403 Encounter for supervision of normal first pregnancy, third trimester: Secondary | ICD-10-CM

## 2021-03-27 LAB — POCT URINALYSIS DIPSTICK OB
Glucose, UA: NEGATIVE
POC,PROTEIN,UA: NEGATIVE

## 2021-03-27 NOTE — Progress Notes (Signed)
Routine Prenatal Care Visit  Subjective  Jacqueline Krueger is a 25 y.o. G1P0 at [redacted]w[redacted]d being seen today for ongoing prenatal care.  She is currently monitored for the following issues for this low-risk pregnancy and has Encounter for supervision of normal first pregnancy in first trimester; Anxiety; Depression; Seizure disorder (HCC); and Overdose, intentional self-harm, initial encounter [facility]) on their problem list.  ----------------------------------------------------------------------------------- Patient reports no complaints.   Contractions: Not present. Vag. Bleeding: None.  Movement: Present. Denies leaking of fluid.  ----------------------------------------------------------------------------------- The following portions of the patient's history were reviewed and updated as appropriate: allergies, current medications, past family history, past medical history, past social history, past surgical history and problem list. Problem list updated.   Objective  Blood pressure 116/70, height 5\' 3"  (1.6 m), weight 163 lb 6.4 oz (74.1 kg), last menstrual period 10/07/2020. Pregravid weight 140 lb (63.5 kg) Total Weight Gain 23 lb 6.4 oz (10.6 kg) Urinalysis:      Fetal Status: Fetal Heart Rate (bpm): 145 Fundal Height: 28 cm Movement: Present     General:  Alert, oriented and cooperative. Patient is in no acute distress.  Skin: Skin is warm and dry. No rash noted.   Cardiovascular: Normal heart rate noted  Respiratory: Normal respiratory effort, no problems with respiration noted  Abdomen: Soft, gravid, appropriate for gestational age. Pain/Pressure: Absent     Pelvic:  Cervical exam deferred        Extremities: Normal range of motion.  Edema: None  Mental Status: Normal mood and affect. Normal behavior. Normal judgment and thought content.     Assessment   25 y.o. G1P0 at [redacted]w[redacted]d by  06/23/2021, by Ultrasound presenting for routine prenatal visit  Plan   pregnancy 1  Problems  (from 11/13/20 to present)     Problem Noted Resolved   Encounter for supervision of normal first pregnancy in first trimester 11/13/2020 by 01/13/2021, CNM No   Overview Addendum 03/27/2021  3:13 PM by 03/29/2021, MD     Nursing Staff Provider  Office Location  [facility] Dating   13 wk Natale Milch  Language  English Anatomy US   normal- spinal follow up needed  Flu Vaccine   Genetic Screen  Fragile-X neg, CF neg, SMA neg  NIPS: declines  TDaP vaccine    Hgb A1C or  GTT Early : Third trimester :   Covid    LAB RESULTS   Rhogam  Not needed Blood Type A/Positive/-- (04/11 1401)   Feeding Plan  Antibody Negative (04/11 1401)  Contraception  Rubella 3.10 (04/11 1401)  Circumcision  RPR Non Reactive (04/11 1401)   Pediatrician   HBsAg Negative (04/11 1401)   Support Person  HIV Non Reactive (04/11 1401)  Prenatal Classes  Varicella     GBS  (For PCN allergy, check sensitivities)   BTL Consent     VBAC Consent  Pap  2022 ASCUS HPV neg    Hgb Electro      CF  not carrier     SMA  not carrier        Unplanned preg Tobacco use Hx of drug OD April 2021 with two weeks inpatient tx and no follow up Referred to social Work              28 week labs today Has anatomy follow up May 2021 scheduled Discussed prenatal classes  Gestational age appropriate obstetric precautions including but not limited to vaginal bleeding, contractions, leaking of fluid and  fetal movement were reviewed in detail with the patient.    Return in about 2 weeks (around 04/10/2021) for ROB in person.  Natale Milch MD [facility], [facility] 03/27/2021, 3:29 PM

## 2021-03-27 NOTE — Patient Instructions (Signed)

## 2021-03-28 LAB — 28 WEEK RH+PANEL
Basophils Absolute: 0.1 10*3/uL (ref 0.0–0.2)
Basos: 0 %
EOS (ABSOLUTE): 0.1 10*3/uL (ref 0.0–0.4)
Eos: 1 %
Gestational Diabetes Screen: 122 mg/dL (ref 65–139)
HIV Screen 4th Generation wRfx: NONREACTIVE
Hematocrit: 34.8 % (ref 34.0–46.6)
Hemoglobin: 12.2 g/dL (ref 11.1–15.9)
Immature Grans (Abs): 0.2 10*3/uL — ABNORMAL HIGH (ref 0.0–0.1)
Immature Granulocytes: 1 %
Lymphocytes Absolute: 2.3 10*3/uL (ref 0.7–3.1)
Lymphs: 14 %
MCH: 31.9 pg (ref 26.6–33.0)
MCHC: 35.1 g/dL (ref 31.5–35.7)
MCV: 91 fL (ref 79–97)
Monocytes Absolute: 0.7 10*3/uL (ref 0.1–0.9)
Monocytes: 4 %
Neutrophils Absolute: 13.2 10*3/uL — ABNORMAL HIGH (ref 1.4–7.0)
Neutrophils: 80 %
Platelets: 344 10*3/uL (ref 150–450)
RBC: 3.82 x10E6/uL (ref 3.77–5.28)
RDW: 12.1 % (ref 11.7–15.4)
RPR Ser Ql: NONREACTIVE
WBC: 16.5 10*3/uL — ABNORMAL HIGH (ref 3.4–10.8)

## 2021-04-02 ENCOUNTER — Ambulatory Visit
Admission: RE | Admit: 2021-04-02 | Discharge: 2021-04-02 | Disposition: A | Discharge status: Home / Self Care | Payer: BC Managed Care – PPO | Source: Ambulatory Visit | Attending: Obstetrics and Gynecology | Admitting: Obstetrics and Gynecology

## 2021-04-02 ENCOUNTER — Other Ambulatory Visit: Payer: Self-pay

## 2021-04-02 DIAGNOSIS — Z3400 Encounter for supervision of normal first pregnancy, unspecified trimester: Secondary | ICD-10-CM

## 2021-04-02 DIAGNOSIS — Z362 Encounter for other antenatal screening follow-up: Secondary | ICD-10-CM

## 2021-04-03 ENCOUNTER — Encounter: Payer: BC Managed Care – PPO | Admitting: Obstetrics and Gynecology

## 2021-04-10 ENCOUNTER — Encounter: Payer: Self-pay | Admitting: Advanced Practice Midwife

## 2021-04-10 ENCOUNTER — Other Ambulatory Visit: Payer: Self-pay

## 2021-04-10 ENCOUNTER — Ambulatory Visit (INDEPENDENT_AMBULATORY_CARE_PROVIDER_SITE_OTHER): Payer: BC Managed Care – PPO | Admitting: Advanced Practice Midwife

## 2021-04-10 VITALS — BP 118/80 | Wt 167.0 lb

## 2021-04-10 DIAGNOSIS — Z3403 Encounter for supervision of normal first pregnancy, third trimester: Secondary | ICD-10-CM

## 2021-04-10 DIAGNOSIS — Z3A29 29 weeks gestation of pregnancy: Secondary | ICD-10-CM

## 2021-04-10 NOTE — Progress Notes (Addendum)
Routine Prenatal Care Visit  Subjective  Jacqueline Krueger is a 25 y.o. G1P0 at [redacted]w[redacted]d being seen today for ongoing prenatal care.  She is currently monitored for the following issues for this low-risk pregnancy and has Encounter for supervision of normal first pregnancy in first trimester; Anxiety; Depression; Seizure disorder (HCC); and Overdose, intentional self-harm, initial encounter [facility]) on their problem list.  ----------------------------------------------------------------------------------- Patient reports no complaints.   Contractions: Not present. Vag. Bleeding: None.  Movement: Present. Leaking Fluid denies.  ----------------------------------------------------------------------------------- The following portions of the patient's history were reviewed and updated as appropriate: allergies, current medications, past family history, past medical history, past social history, past surgical history and problem list. Problem list updated.  Objective  Blood pressure 118/80, weight 167 lb (75.8 kg), last menstrual period 10/07/2020. Pregravid weight 140 lb (63.5 kg) Total Weight Gain 27 lb (12.2 kg) Urinalysis: Urine Protein    Urine Glucose    Fetal Status: Fetal Heart Rate (bpm): 141 Fundal Height: 29 cm Movement: Present     General:  Alert, oriented and cooperative. Patient is in no acute distress.  Skin: Skin is warm and dry. No rash noted.   Cardiovascular: Normal heart rate noted  Respiratory: Normal respiratory effort, no problems with respiration noted  Abdomen: Soft, gravid, appropriate for gestational age. Pain/Pressure: Absent     Pelvic:  Cervical exam deferred        Extremities: Normal range of motion.  Edema: None  Mental Status: Normal mood and affect. Normal behavior. Normal judgment and thought content.   Assessment   25 y.o. G1P0 at [redacted]w[redacted]d by  06/23/2021, by Ultrasound presenting for routine prenatal visit  Plan   pregnancy 1  Problems (from 11/13/20 to  present)     Problem Noted Resolved   Encounter for supervision of normal first pregnancy in first trimester 11/13/2020 by Mirna Mires, CNM No   Overview Addendum 04/03/2021  1:18 PM by Vena Austria, MD     Nursing Staff Provider  Office Location  [facility] Dating   13 wk [place]  Language  English Anatomy US   normal  Flu Vaccine   Genetic Screen  Fragile-X neg, CF neg, SMA neg  NIPS: declines  TDaP vaccine    Hgb A1C or  GTT Early : Third trimester : 122  Covid    LAB RESULTS   Rhogam  Not needed Blood Type A/Positive/-- (04/11 1401)   Feeding Plan Considering breast Antibody Negative (04/11 1401)  Contraception Undecided at today's visit Rubella 3.10 (04/11 1401)  Circumcision  RPR Non Reactive (04/11 1401)   Pediatrician   HBsAg Negative (04/11 1401)   Support Person  HIV Non Reactive (04/11 1401)  Prenatal Classes discussed Varicella  immune    GBS  (For PCN allergy, check sensitivities)   BTL Consent     VBAC Consent  Pap  2022 ASCUS HPV neg    Hgb Electro      CF  not carrier     SMA  not carrier         Unplanned preg Tobacco use Hx of drug OD April 2021 with two weeks inpatient tx and no follow up Referred to social Work              Preterm labor symptoms and general obstetric precautions including but not limited to vaginal bleeding, contractions, leaking of fluid and fetal movement were reviewed in detail with the patient.    Return in about 2 weeks (around 04/24/2021) for rob.  Tresea Mall, CNM 04/10/2021 3:22 PM

## 2021-04-25 ENCOUNTER — Encounter: Payer: Self-pay | Admitting: Advanced Practice Midwife

## 2021-04-25 ENCOUNTER — Other Ambulatory Visit: Payer: Self-pay

## 2021-04-25 ENCOUNTER — Ambulatory Visit (INDEPENDENT_AMBULATORY_CARE_PROVIDER_SITE_OTHER): Payer: BC Managed Care – PPO | Admitting: Advanced Practice Midwife

## 2021-04-25 VITALS — BP 110/70 | Wt 168.0 lb

## 2021-04-25 DIAGNOSIS — Z3403 Encounter for supervision of normal first pregnancy, third trimester: Secondary | ICD-10-CM

## 2021-04-25 DIAGNOSIS — Z23 Encounter for immunization: Secondary | ICD-10-CM

## 2021-04-25 DIAGNOSIS — Z3A31 31 weeks gestation of pregnancy: Secondary | ICD-10-CM

## 2021-04-25 LAB — POCT URINALYSIS DIPSTICK OB
Glucose, UA: NEGATIVE
POC,PROTEIN,UA: NEGATIVE

## 2021-04-25 NOTE — Progress Notes (Signed)
Routine Prenatal Care Visit  Subjective  Jacqueline Krueger is a 25 y.o. G1P0 at [redacted]w[redacted]d being seen today for ongoing prenatal care.  She is currently monitored for the following issues for this low-risk pregnancy and has Encounter for supervision of normal first pregnancy in first trimester; Anxiety; Depression; Seizure disorder (HCC); and Overdose, intentional self-harm, initial encounter [facility]) on their problem list.  ----------------------------------------------------------------------------------- Patient reports no complaints.   Contractions: Not present. Vag. Bleeding: None.  Movement: Present. Leaking Fluid denies.  ----------------------------------------------------------------------------------- The following portions of the patient's history were reviewed and updated as appropriate: allergies, current medications, past family history, past medical history, past social history, past surgical history and problem list. Problem list updated.  Objective  Blood pressure 110/70, weight 168 lb (76.2 kg), last menstrual period 10/07/2020. Pregravid weight 140 lb (63.5 kg) Total Weight Gain 28 lb (12.7 kg) Urinalysis: Urine Protein Negative  Urine Glucose Negative  Fetal Status: Fetal Heart Rate (bpm): 145 Fundal Height: 31 cm Movement: Present     General:  Alert, oriented and cooperative. Patient is in no acute distress.  Skin: Skin is warm and dry. No rash noted.   Cardiovascular: Normal heart rate noted  Respiratory: Normal respiratory effort, no problems with respiration noted  Abdomen: Soft, gravid, appropriate for gestational age. Pain/Pressure: Absent     Pelvic:  Cervical exam deferred        Extremities: Normal range of motion.  Edema: None  Mental Status: Normal mood and affect. Normal behavior. Normal judgment and thought content.   Assessment   25 y.o. G1P0 at 105w4d by  06/23/2021, by Ultrasound presenting for routine prenatal visit  Plan   pregnancy 1  Problems (from  11/13/20 to present)    Problem Noted Resolved   Encounter for supervision of normal first pregnancy in first trimester 11/13/2020 by Mirna Mires, CNM No   Overview Addendum 04/25/2021 10:16 AM by Tresea Mall, CNM     Nursing Staff Provider  Office Location  [facility] Dating   13 wk [place]  Language  English Anatomy US   normal  Flu Vaccine   Genetic Screen  Fragile-X neg, CF neg, SMA neg  NIPS: declines  TDaP vaccine   04/25/21 Hgb A1C or  GTT Early : Third trimester : 122  Covid    LAB RESULTS   Rhogam  Not needed Blood Type A/Positive/-- (04/11 1401)   Feeding Plan Considering breast Antibody Negative (04/11 1401)  Contraception Undecided at 29w visit Rubella 3.10 (04/11 1401)  Circumcision  RPR Non Reactive (04/11 1401)   Pediatrician   HBsAg Negative (04/11 1401)   Support Person  HIV Non Reactive (04/11 1401)  Prenatal Classes discussed Varicella  immune    GBS  (For PCN allergy, check sensitivities)   BTL Consent     VBAC Consent  Pap  2022 ASCUS HPV neg    Hgb Electro      CF  not carrier     SMA  not carrier         Unplanned preg Tobacco use Hx of drug OD April 2021 with two weeks inpatient tx and no follow up Referred to social Work             Preterm labor symptoms and general obstetric precautions including but not limited to vaginal bleeding, contractions, leaking of fluid and fetal movement were reviewed in detail with the patient.    Return in about 2 weeks (around 05/09/2021) for rob.  Tresea Mall, CNM 04/25/2021  10:23 AM

## 2021-04-25 NOTE — Progress Notes (Signed)
ROB- TDAP/BT consent signed today, no concerns

## 2021-05-09 ENCOUNTER — Ambulatory Visit (INDEPENDENT_AMBULATORY_CARE_PROVIDER_SITE_OTHER): Payer: BC Managed Care – PPO | Admitting: Obstetrics

## 2021-05-09 ENCOUNTER — Other Ambulatory Visit: Payer: Self-pay

## 2021-05-09 VITALS — BP 120/72 | Wt 171.0 lb

## 2021-05-09 DIAGNOSIS — Z3A33 33 weeks gestation of pregnancy: Secondary | ICD-10-CM

## 2021-05-09 DIAGNOSIS — Z3403 Encounter for supervision of normal first pregnancy, third trimester: Secondary | ICD-10-CM

## 2021-05-09 LAB — POCT URINALYSIS DIPSTICK OB
Glucose, UA: NEGATIVE
POC,PROTEIN,UA: NEGATIVE

## 2021-05-09 NOTE — Progress Notes (Signed)
Routine Prenatal Care Visit  Subjective  Jacqueline Krueger is a 25 y.o. G1P0 at [redacted]w[redacted]d being seen today for ongoing prenatal care.  She is currently monitored for the following issues for this high-risk pregnancy and has Encounter for supervision of normal first pregnancy in first trimester; Anxiety; Depression; Seizure disorder (HCC); and Overdose, intentional self-harm, initial encounter [facility]) on their problem list.  ----------------------------------------------------------------------------------- Patient reports no complaints.  She does report that prior to pregnancy she had occasional episodes where her hands would shake and her legs would also jitter, and she would not be able to walk. This has now been occurring weekly. She has never had this worked up. She is not taking Effexor ( previous use after her suicide attempt, I believe) and could not remember who prescribed it or why she was given this medication. Discussed her higher risk of mood issues postpartum.  .  .   Pincus Large Fluid denies.  ----------------------------------------------------------------------------------- The following portions of the patient's history were reviewed and updated as appropriate: allergies, current medications, past family history, past medical history, past social history, past surgical history and problem list. Problem list updated.  Objective  Blood pressure 120/72, weight 171 lb (77.6 kg), last menstrual period 10/07/2020. Pregravid weight 140 lb (63.5 kg) Total Weight Gain 31 lb (14.1 kg) Urinalysis: Urine Protein    Urine Glucose    Fetal Status:           General:  Alert, oriented and cooperative. Patient is in no acute distress.  Skin: Skin is warm and dry. No rash noted.   Cardiovascular: Normal heart rate noted  Respiratory: Normal respiratory effort, no problems with respiration noted  Abdomen: Soft, gravid, appropriate for gestational age.       Pelvic:  Cervical exam deferred         Extremities: Normal range of motion.     Mental Status: Normal mood and affect. Normal behavior. Normal judgment and thought content.   Assessment   25 y.o. G1P0 at [redacted]w[redacted]d by  06/23/2021, by Ultrasound presenting for routine prenatal visit  Plan   pregnancy 1  Problems (from 11/13/20 to present)    Problem Noted Resolved   Encounter for supervision of normal first pregnancy in first trimester 11/13/2020 by Mirna Mires, CNM No   Overview Addendum 04/25/2021 10:16 AM by Tresea Mall, CNM     Nursing Staff Provider  Office Location  [facility] Dating   13 wk [place]  Language  English Anatomy US   normal  Flu Vaccine   Genetic Screen  Fragile-X neg, CF neg, SMA neg  NIPS: declines  TDaP vaccine   04/25/21 Hgb A1C or  GTT Early : Third trimester : 122  Covid    LAB RESULTS   Rhogam  Not needed Blood Type A/Positive/-- (04/11 1401)   Feeding Plan Considering breast Antibody Negative (04/11 1401)  Contraception Undecided at 29w visit Rubella 3.10 (04/11 1401)  Circumcision  RPR Non Reactive (04/11 1401)   Pediatrician   HBsAg Negative (04/11 1401)   Support Person  HIV Non Reactive (04/11 1401)  Prenatal Classes discussed Varicella  immune    GBS  (For PCN allergy, check sensitivities)   BTL Consent     VBAC Consent  Pap  2022 ASCUS HPV neg    Hgb Electro      CF  not carrier     SMA  not carrier         Unplanned preg Tobacco use Hx of drug  OD April 2021 with two weeks inpatient tx and no follow up Referred to social Work             Preterm labor symptoms and general obstetric precautions including but not limited to vaginal bleeding, contractions, leaking of fluid and fetal movement were reviewed in detail with the patient. Please refer to After Visit Summary for other counseling recommendations.  Discussed her symptoms of hand and feet trmors, that have never been evaluated- encouraged her to discuss this after she delivers and that a referral is a wise idea.  Aslo  encouraged her to be aware of her emotional status post delivery, as she is at increased risk of PP depression then.  Return in about 1 week (around 05/16/2021) for return OB.  Mirna Mires, CNM  05/09/2021 11:27 AM

## 2021-05-09 NOTE — Progress Notes (Signed)
ROB - no concerns. RM 5 

## 2021-05-16 ENCOUNTER — Encounter: Payer: Self-pay | Admitting: Advanced Practice Midwife

## 2021-05-16 ENCOUNTER — Ambulatory Visit (INDEPENDENT_AMBULATORY_CARE_PROVIDER_SITE_OTHER): Payer: BC Managed Care – PPO | Admitting: Advanced Practice Midwife

## 2021-05-16 ENCOUNTER — Other Ambulatory Visit: Payer: Self-pay

## 2021-05-16 VITALS — BP 90/60 | Wt 170.0 lb

## 2021-05-16 DIAGNOSIS — Z3A34 34 weeks gestation of pregnancy: Secondary | ICD-10-CM

## 2021-05-16 DIAGNOSIS — Z3403 Encounter for supervision of normal first pregnancy, third trimester: Secondary | ICD-10-CM

## 2021-05-16 NOTE — Progress Notes (Signed)
Routine Prenatal Care Visit  Subjective  Jacqueline Krueger is a 25 y.o. G1P0 at [redacted]w[redacted]d being seen today for ongoing prenatal care.  She is currently monitored for the following issues for this low-risk pregnancy and has Encounter for supervision of normal first pregnancy in first trimester; Anxiety; Depression; Seizure disorder (HCC); and Overdose, intentional self-harm, initial encounter [facility] on their problem list.  ----------------------------------------------------------------------------------- Patient reports no complaints.   Contractions: Not present. Vag. Bleeding: None.  Movement: Present. Leaking Fluid denies.  ----------------------------------------------------------------------------------- The following portions of the patient's history were reviewed and updated as appropriate: allergies, current medications, past family history, past medical history, past social history, past surgical history and problem list. Problem list updated.  Objective  Blood pressure 90/60, weight 170 lb (77.1 kg), last menstrual period 10/07/2020. Pregravid weight 140 lb (63.5 kg) Total Weight Gain 30 lb (13.6 kg) Urinalysis: Urine Protein    Urine Glucose    Fetal Status: Fetal Heart Rate (bpm): 131 Fundal Height: 34 cm Movement: Present     General:  Alert, oriented and cooperative. Patient is in no acute distress.  Skin: Skin is warm and dry. No rash noted.   Cardiovascular: Normal heart rate noted  Respiratory: Normal respiratory effort, no problems with respiration noted  Abdomen: Soft, gravid, appropriate for gestational age. Pain/Pressure: Absent     Pelvic:  Cervical exam deferred        Extremities: Normal range of motion.  Edema: None  Mental Status: Normal mood and affect. Normal behavior. Normal judgment and thought content.   Assessment   25 y.o. G1P0 at [redacted]w[redacted]d by  06/23/2021, by Ultrasound presenting for routine prenatal visit  Plan   pregnancy 1  Problems (from 11/13/20 to  present)    Problem Noted Resolved   Encounter for supervision of normal first pregnancy in first trimester 11/13/2020 by Mirna Mires, CNM No   Overview Addendum 04/25/2021 10:16 AM by Tresea Mall, CNM     Nursing Staff Provider  Office Location  [facility] Dating   13 wk [place]  Language  English Anatomy US   normal  Flu Vaccine   Genetic Screen  Fragile-X neg, CF neg, SMA neg  NIPS: declines  TDaP vaccine   04/25/21 Hgb A1C or  GTT Early : Third trimester : 122  Covid    LAB RESULTS   Rhogam  Not needed Blood Type A/Positive/-- (04/11 1401)   Feeding Plan Considering breast Antibody Negative (04/11 1401)  Contraception Considering nexplanon Rubella 3.10 (04/11 1401)  Circumcision  RPR Non Reactive (04/11 1401)   Pediatrician   HBsAg Negative (04/11 1401)   Support Person  HIV Non Reactive (04/11 1401)  Prenatal Classes discussed Varicella  immune    GBS  (For PCN allergy, check sensitivities)   BTL Consent     VBAC Consent  Pap  2022 ASCUS HPV neg    Hgb Electro      CF  not carrier     SMA  not carrier         Unplanned preg Tobacco use Hx of drug OD April 2021 with two weeks inpatient tx and no follow up Referred to social Work             Preterm labor symptoms and general obstetric precautions including but not limited to vaginal bleeding, contractions, leaking of fluid and fetal movement were reviewed in detail with the patient.   Return in about 2 weeks (around 05/30/2021) for rob.  Tresea Mall, CNM 05/16/2021 8:24  AM

## 2021-05-16 NOTE — Progress Notes (Signed)
ROB- no concerns 

## 2021-06-02 ENCOUNTER — Encounter: Payer: Self-pay | Admitting: Advanced Practice Midwife

## 2021-06-02 ENCOUNTER — Other Ambulatory Visit: Payer: Self-pay

## 2021-06-02 ENCOUNTER — Other Ambulatory Visit (HOSPITAL_COMMUNITY)
Admission: RE | Admit: 2021-06-02 | Discharge: 2021-06-02 | Disposition: A | Discharge status: Home / Self Care | Payer: BC Managed Care – PPO | Source: Ambulatory Visit | Attending: Advanced Practice Midwife | Admitting: Advanced Practice Midwife

## 2021-06-02 ENCOUNTER — Ambulatory Visit (INDEPENDENT_AMBULATORY_CARE_PROVIDER_SITE_OTHER): Payer: BC Managed Care – PPO | Admitting: Advanced Practice Midwife

## 2021-06-02 VITALS — BP 110/70 | Wt 175.0 lb

## 2021-06-02 DIAGNOSIS — Z3403 Encounter for supervision of normal first pregnancy, third trimester: Secondary | ICD-10-CM | POA: Diagnosis present

## 2021-06-02 DIAGNOSIS — Z113 Encounter for screening for infections with a predominantly sexual mode of transmission: Secondary | ICD-10-CM | POA: Insufficient documentation

## 2021-06-02 DIAGNOSIS — Z3685 Encounter for antenatal screening for Streptococcus B: Secondary | ICD-10-CM

## 2021-06-02 DIAGNOSIS — Z3A37 37 weeks gestation of pregnancy: Secondary | ICD-10-CM

## 2021-06-02 DIAGNOSIS — Z369 Encounter for antenatal screening, unspecified: Secondary | ICD-10-CM | POA: Insufficient documentation

## 2021-06-02 NOTE — Progress Notes (Signed)
  Routine Prenatal Care Visit  Subjective  Jacqueline Krueger is a 25 y.o. G1P0 at [redacted]w[redacted]d being seen today for ongoing prenatal care.  She is currently monitored for the following issues for this low-risk pregnancy and has Supervision of normal pregnancy; Anxiety; Depression; Seizure disorder (HCC); and Overdose, intentional self-harm, initial encounter [facility]) on their problem list.  ----------------------------------------------------------------------------------- Patient reports no complaints.   Contractions: Not present. Vag. Bleeding: None.  Movement: Present. Leaking Fluid denies.  ----------------------------------------------------------------------------------- The following portions of the patient's history were reviewed and updated as appropriate: allergies, current medications, past family history, past medical history, past social history, past surgical history and problem list. Problem list updated.  Objective  Blood pressure 110/70, weight 175 lb (79.4 kg), last menstrual period 10/07/2020. Pregravid weight 140 lb (63.5 kg) Total Weight Gain 35 lb (15.9 kg) Urinalysis: Urine Protein    Urine Glucose    Fetal Status: Fetal Heart Rate (bpm): 139 Fundal Height: 36 cm Movement: Present     General:  Alert, oriented and cooperative. Patient is in no acute distress.  Skin: Skin is warm and dry. No rash noted.   Cardiovascular: Normal heart rate noted  Respiratory: Normal respiratory effort, no problems with respiration noted  Abdomen: Soft, gravid, appropriate for gestational age. Pain/Pressure: Absent     Pelvic:  GBS/aptima collected        Extremities: Normal range of motion.     Mental Status: Normal mood and affect. Normal behavior. Normal judgment and thought content.   Assessment   25 y.o. G1P0 at [redacted]w[redacted]d by  06/23/2021, by Ultrasound presenting for routine prenatal visit  Plan   pregnancy 1  Problems (from 11/13/20 to present)    Problem Noted Resolved   Supervision of  normal pregnancy 11/13/2020 by Mirna Mires, CNM No   Overview Addendum 05/16/2021  8:26 AM by Tresea Mall, CNM     Nursing Staff Provider  Office Location  [facility] Dating   13 wk [place]  Language  English Anatomy US   normal  Flu Vaccine   Genetic Screen  Fragile-X neg, CF neg, SMA neg  NIPS: declines  TDaP vaccine   04/25/21 Hgb A1C or  GTT Early : Third trimester : 122  Covid    LAB RESULTS   Rhogam  Not needed Blood Type A/Positive/-- (04/11 1401)   Feeding Plan Considering breast Antibody Negative (04/11 1401)  Contraception Considering nexplanon Rubella 3.10 (04/11 1401)  Circumcision  RPR Non Reactive (04/11 1401)   Pediatrician   HBsAg Negative (04/11 1401)   Support Person  HIV Non Reactive (04/11 1401)  Prenatal Classes discussed Varicella  immune    GBS  (For PCN allergy, check sensitivities)   BTL Consent     VBAC Consent  Pap  2022 ASCUS HPV neg    Hgb Electro      CF  not carrier     SMA  not carrier         Unplanned preg Tobacco use Hx of drug OD April 2021 with two weeks inpatient tx and no follow up Referred to social Work             Preterm labor symptoms and general obstetric precautions including but not limited to vaginal bleeding, contractions, leaking of fluid and fetal movement were reviewed in detail with the patient.    Return in about 1 week (around 06/09/2021) for rob.  Tresea Mall, CNM 06/02/2021 9:26 AM

## 2021-06-04 LAB — CERVICOVAGINAL ANCILLARY ONLY
Chlamydia: NEGATIVE
Comment: NEGATIVE
Comment: NEGATIVE
Comment: NORMAL
Neisseria Gonorrhea: NEGATIVE
Trichomonas: NEGATIVE

## 2021-06-04 LAB — STREP GP B NAA: Strep Gp B NAA: NEGATIVE

## 2021-06-09 ENCOUNTER — Ambulatory Visit (INDEPENDENT_AMBULATORY_CARE_PROVIDER_SITE_OTHER): Payer: BC Managed Care – PPO | Admitting: Obstetrics

## 2021-06-09 ENCOUNTER — Other Ambulatory Visit: Payer: Self-pay

## 2021-06-09 VITALS — BP 118/78 | Wt 177.0 lb

## 2021-06-09 DIAGNOSIS — Z3400 Encounter for supervision of normal first pregnancy, unspecified trimester: Secondary | ICD-10-CM

## 2021-06-09 DIAGNOSIS — Z3A38 38 weeks gestation of pregnancy: Secondary | ICD-10-CM

## 2021-06-09 NOTE — Progress Notes (Signed)
No vb. No lof. Cervical check today  °

## 2021-06-09 NOTE — Progress Notes (Signed)
Routine Prenatal Care Visit  Subjective  Jacqueline Krueger is a 25 y.o. G1P0 at [redacted]w[redacted]d being seen today for ongoing prenatal care.  She is currently monitored for the following issues for this high-risk pregnancy and has Supervision of normal pregnancy; Anxiety; Depression; Seizure disorder (HCC); and Overdose, intentional self-harm, initial encounter [facility]) on their problem list.  ----------------------------------------------------------------------------------- Patient reports no complaints.  She wants to be checked today. Contractions: Not present. Vag. Bleeding: None.  Movement: Present. Leaking Fluid denies.  ----------------------------------------------------------------------------------- The following portions of the patient's history were reviewed and updated as appropriate: allergies, current medications, past family history, past medical history, past social history, past surgical history and problem list. Problem list updated.  Objective  Blood pressure 118/78, weight 177 lb (80.3 kg), last menstrual period 10/07/2020. Pregravid weight 140 lb (63.5 kg) Total Weight Gain 37 lb (16.8 kg) Urinalysis: Urine Protein    Urine Glucose    Fetal Status:     Movement: Present     General:  Alert, oriented and cooperative. Patient is in no acute distress.  Skin: Skin is warm and dry. No rash noted.   Cardiovascular: Normal heart rate noted  Respiratory: Normal respiratory effort, no problems with respiration noted  Abdomen: Soft, gravid, appropriate for gestational age. Pain/Pressure: Absent     Pelvic:  Cervical exam performed      1.5cms/70%/-2  Extremities: Normal range of motion.  Edema: None  Mental Status: Normal mood and affect. Normal behavior. Normal judgment and thought content.   Assessment   25 y.o. G1P0 at [redacted]w[redacted]d by  06/23/2021, by Ultrasound presenting for routine prenatal visit  Plan   pregnancy 1  Problems (from 11/13/20 to present)    Problem Noted Resolved    Supervision of normal pregnancy 11/13/2020 by Mirna Mires, CNM No   Overview Addendum 06/09/2021 10:42 AM by Mirna Mires, CNM     Nursing Staff Provider  Office Location  [facility] Dating   13 wk [place]  Language  English Anatomy US   normal  Flu Vaccine   Genetic Screen  Fragile-X neg, CF neg, SMA neg  NIPS: declines  TDaP vaccine   04/25/21 Hgb A1C or  GTT Early : Third trimester : 122  Covid    LAB RESULTS   Rhogam  Not needed Blood Type A/Positive/-- (04/11 1401)   Feeding Plan Considering breast Antibody Negative (04/11 1401)  Contraception Considering nexplanon Rubella 3.10 (04/11 1401)  Circumcision  RPR Non Reactive (07/14 1514)   Pediatrician   HBsAg Negative (04/11 1401)   Support Person  HIV Non Reactive (07/14 1514)  Prenatal Classes discussed Varicella  immune    GBS Negative/-- (09/19 0939)(For PCN allergy, check sensitivities)   BTL Consent   Negative   VBAC Consent  Pap  2022 ASCUS HPV neg    Hgb Electro      CF  not carrier     SMA  not carrier         Unplanned preg Tobacco use Hx of drug OD April 2021 with two weeks inpatient tx and no follow up Referred to social Work             Term labor symptoms and general obstetric precautions including but not limited to vaginal bleeding, contractions, leaking of fluid and fetal movement were reviewed in detail with the patient. Please refer to After Visit Summary for other counseling recommendations.   Return in about 1 week (around 06/16/2021) for return OB.  Mirna Mires,  CNM  06/09/2021 10:54 AM

## 2021-06-14 ENCOUNTER — Inpatient Hospital Stay
Admission: EM | Admit: 2021-06-14 | Discharge: 2021-06-16 | DRG: 807 | Disposition: A | Discharge status: Home / Self Care | Payer: BC Managed Care – PPO | Attending: Obstetrics and Gynecology | Admitting: Obstetrics and Gynecology

## 2021-06-14 ENCOUNTER — Other Ambulatory Visit: Payer: Self-pay

## 2021-06-14 ENCOUNTER — Encounter: Payer: Self-pay | Admitting: Obstetrics and Gynecology

## 2021-06-14 DIAGNOSIS — F17210 Nicotine dependence, cigarettes, uncomplicated: Secondary | ICD-10-CM | POA: Diagnosis present

## 2021-06-14 DIAGNOSIS — O4290 Premature rupture of membranes, unspecified as to length of time between rupture and onset of labor, unspecified weeks of gestation: Secondary | ICD-10-CM | POA: Diagnosis present

## 2021-06-14 DIAGNOSIS — O4292 Full-term premature rupture of membranes, unspecified as to length of time between rupture and onset of labor: Principal | ICD-10-CM | POA: Diagnosis present

## 2021-06-14 DIAGNOSIS — Z3A38 38 weeks gestation of pregnancy: Secondary | ICD-10-CM

## 2021-06-14 DIAGNOSIS — O26893 Other specified pregnancy related conditions, third trimester: Secondary | ICD-10-CM | POA: Diagnosis present

## 2021-06-14 DIAGNOSIS — O4202 Full-term premature rupture of membranes, onset of labor within 24 hours of rupture: Secondary | ICD-10-CM | POA: Diagnosis not present

## 2021-06-14 DIAGNOSIS — O99334 Smoking (tobacco) complicating childbirth: Secondary | ICD-10-CM | POA: Diagnosis present

## 2021-06-14 DIAGNOSIS — Z3400 Encounter for supervision of normal first pregnancy, unspecified trimester: Secondary | ICD-10-CM

## 2021-06-14 DIAGNOSIS — Z20822 Contact with and (suspected) exposure to covid-19: Secondary | ICD-10-CM | POA: Diagnosis present

## 2021-06-14 LAB — CBC
HCT: 37.8 % (ref 36.0–46.0)
Hemoglobin: 13.1 g/dL (ref 12.0–15.0)
MCH: 31.5 pg (ref 26.0–34.0)
MCHC: 34.7 g/dL (ref 30.0–36.0)
MCV: 90.9 fL (ref 80.0–100.0)
Platelets: 342 10*3/uL (ref 150–400)
RBC: 4.16 MIL/uL (ref 3.87–5.11)
RDW: 13.2 % (ref 11.5–15.5)
WBC: 17.1 10*3/uL — ABNORMAL HIGH (ref 4.0–10.5)
nRBC: 0 % (ref 0.0–0.2)

## 2021-06-14 LAB — RESP PANEL BY RT-PCR (FLU A&B, COVID) ARPGX2
Influenza A by PCR: NEGATIVE
Influenza B by PCR: NEGATIVE
SARS Coronavirus 2 by RT PCR: NEGATIVE

## 2021-06-14 LAB — RUPTURE OF MEMBRANE (ROM)PLUS: Rom Plus: POSITIVE

## 2021-06-14 LAB — RAPID HIV SCREEN (HIV 1/2 AB+AG)
HIV 1/2 Antibodies: NONREACTIVE
HIV-1 P24 Antigen - HIV24: NONREACTIVE

## 2021-06-14 MED ORDER — ONDANSETRON HCL 4 MG/2ML IJ SOLN: 4.0000 mg | Freq: Four times a day (QID) | INTRAMUSCULAR | Status: DC | PRN

## 2021-06-14 MED ORDER — OXYTOCIN-SODIUM CHLORIDE 30-0.9 UT/500ML-% IV SOLN
2.5000 [IU]/h | INTRAVENOUS | Status: DC
  Administered 2021-06-15: 2.5 [IU]/h via INTRAVENOUS
  Filled 2021-06-14: qty 500

## 2021-06-14 MED ORDER — OXYTOCIN BOLUS FROM INFUSION
333.0000 mL | Freq: Once | INTRAVENOUS | Status: AC
  Administered 2021-06-15: 333 mL via INTRAVENOUS

## 2021-06-14 MED ORDER — ACETAMINOPHEN 325 MG PO TABS: 650.0000 mg | ORAL_TABLET | ORAL | Status: DC | PRN

## 2021-06-14 MED ORDER — LACTATED RINGERS IV SOLN: 500.0000 mL | INTRAVENOUS | Status: DC | PRN

## 2021-06-14 MED ORDER — BUTORPHANOL TARTRATE 1 MG/ML IJ SOLN: 2.0000 mg | INTRAMUSCULAR | Status: DC | PRN

## 2021-06-14 MED ORDER — AMMONIA AROMATIC IN INHA
RESPIRATORY_TRACT | Status: AC
  Filled 2021-06-14: qty 10

## 2021-06-14 MED ORDER — OXYTOCIN 10 UNIT/ML IJ SOLN
INTRAMUSCULAR | Status: AC
  Filled 2021-06-14: qty 2

## 2021-06-14 MED ORDER — LIDOCAINE HCL (PF) 1 % IJ SOLN: 30.0000 mL | INTRAMUSCULAR | Status: AC | PRN

## 2021-06-14 MED ORDER — SOD CITRATE-CITRIC ACID 500-334 MG/5ML PO SOLN: 30.0000 mL | ORAL | Status: DC | PRN

## 2021-06-14 MED ORDER — MISOPROSTOL 25 MCG QUARTER TABLET
25.0000 ug | ORAL_TABLET | ORAL | Status: DC | PRN
  Administered 2021-06-14: 25 ug via VAGINAL
  Filled 2021-06-14 (×2): qty 1

## 2021-06-14 MED ORDER — MISOPROSTOL 200 MCG PO TABS
ORAL_TABLET | ORAL | Status: AC
  Filled 2021-06-14: qty 4

## 2021-06-14 MED ORDER — LIDOCAINE HCL (PF) 1 % IJ SOLN
INTRAMUSCULAR | Status: AC
  Administered 2021-06-15: 30 mL via SUBCUTANEOUS
  Filled 2021-06-14: qty 30

## 2021-06-14 MED ORDER — LACTATED RINGERS IV SOLN: INTRAVENOUS | Status: DC

## 2021-06-15 ENCOUNTER — Inpatient Hospital Stay: Payer: BC Managed Care – PPO | Admitting: Anesthesiology

## 2021-06-15 ENCOUNTER — Encounter: Payer: Self-pay | Admitting: Obstetrics and Gynecology

## 2021-06-15 DIAGNOSIS — O4202 Full-term premature rupture of membranes, onset of labor within 24 hours of rupture: Secondary | ICD-10-CM

## 2021-06-15 DIAGNOSIS — Z3A38 38 weeks gestation of pregnancy: Secondary | ICD-10-CM

## 2021-06-15 LAB — TYPE AND SCREEN
ABO/RH(D): A POS
Antibody Screen: NEGATIVE

## 2021-06-15 LAB — ABO/RH: ABO/RH(D): A POS

## 2021-06-15 LAB — RPR: RPR Ser Ql: NONREACTIVE

## 2021-06-15 MED ORDER — CALCIUM CARBONATE ANTACID 500 MG PO CHEW
400.0000 mg | CHEWABLE_TABLET | Freq: Once | ORAL | Status: AC
  Administered 2021-06-15: 400 mg via ORAL
  Filled 2021-06-15: qty 2

## 2021-06-15 MED ORDER — PHENYLEPHRINE 40 MCG/ML (10ML) SYRINGE FOR IV PUSH (FOR BLOOD PRESSURE SUPPORT): 80.0000 ug | PREFILLED_SYRINGE | INTRAVENOUS | Status: DC | PRN

## 2021-06-15 MED ORDER — DIPHENHYDRAMINE HCL 50 MG/ML IJ SOLN
50.0000 mg | Freq: Once | INTRAMUSCULAR | Status: AC
  Filled 2021-06-15: qty 1

## 2021-06-15 MED ORDER — IBUPROFEN 600 MG PO TABS
ORAL_TABLET | ORAL | Status: AC
  Administered 2021-06-15: 600 mg via ORAL
  Filled 2021-06-15: qty 1

## 2021-06-15 MED ORDER — SIMETHICONE 80 MG PO CHEW: 80.0000 mg | CHEWABLE_TABLET | ORAL | Status: DC | PRN

## 2021-06-15 MED ORDER — DIBUCAINE (PERIANAL) 1 % EX OINT: 1.0000 "application " | TOPICAL_OINTMENT | CUTANEOUS | Status: DC | PRN

## 2021-06-15 MED ORDER — DIPHENHYDRAMINE HCL 50 MG/ML IJ SOLN
12.5000 mg | INTRAMUSCULAR | Status: DC | PRN
Start: 2021-06-15 — End: 2021-06-15

## 2021-06-15 MED ORDER — ONDANSETRON HCL 4 MG PO TABS
4.0000 mg | ORAL_TABLET | ORAL | Status: DC | PRN
  Filled 2021-06-15: qty 1

## 2021-06-15 MED ORDER — ZOLPIDEM TARTRATE 5 MG PO TABS: 5.0000 mg | ORAL_TABLET | Freq: Every evening | ORAL | Status: DC | PRN

## 2021-06-15 MED ORDER — COCONUT OIL OIL: 1.0000 "application " | TOPICAL_OIL | Status: DC | PRN

## 2021-06-15 MED ORDER — FENTANYL-BUPIVACAINE-NACL 0.5-0.125-0.9 MG/250ML-% EP SOLN
EPIDURAL | Status: AC
  Filled 2021-06-15: qty 250

## 2021-06-15 MED ORDER — DOCUSATE SODIUM 100 MG PO CAPS
100.0000 mg | ORAL_CAPSULE | Freq: Two times a day (BID) | ORAL | Status: DC
  Administered 2021-06-15 – 2021-06-16 (×2): 100 mg via ORAL
  Filled 2021-06-15 (×2): qty 1

## 2021-06-15 MED ORDER — FENTANYL-BUPIVACAINE-NACL 0.5-0.125-0.9 MG/250ML-% EP SOLN
12.0000 mL/h | EPIDURAL | Status: DC | PRN
  Administered 2021-06-15: 12 mL/h via EPIDURAL

## 2021-06-15 MED ORDER — TERBUTALINE SULFATE 1 MG/ML IJ SOLN: 0.2500 mg | Freq: Once | INTRAMUSCULAR | Status: DC | PRN

## 2021-06-15 MED ORDER — EPHEDRINE 5 MG/ML INJ: 10.0000 mg | INTRAVENOUS | Status: DC | PRN

## 2021-06-15 MED ORDER — IBUPROFEN 600 MG PO TABS
600.0000 mg | ORAL_TABLET | Freq: Four times a day (QID) | ORAL | Status: DC
  Administered 2021-06-15 – 2021-06-16 (×4): 600 mg via ORAL
  Filled 2021-06-15 (×4): qty 1

## 2021-06-15 MED ORDER — FENTANYL CITRATE (PF) 100 MCG/2ML IJ SOLN: 50.0000 ug | Freq: Once | INTRAMUSCULAR | Status: DC

## 2021-06-15 MED ORDER — LACTATED RINGERS IV SOLN
500.0000 mL | Freq: Once | INTRAVENOUS | Status: AC
  Administered 2021-06-15: 500 mL via INTRAVENOUS

## 2021-06-15 MED ORDER — OXYTOCIN-SODIUM CHLORIDE 30-0.9 UT/500ML-% IV SOLN
1.0000 m[IU]/min | INTRAVENOUS | Status: DC
  Administered 2021-06-15: 2 m[IU]/min via INTRAVENOUS

## 2021-06-15 MED ORDER — DIPHENHYDRAMINE HCL 50 MG/ML IJ SOLN
25.0000 mg | Freq: Once | INTRAMUSCULAR | Status: AC
  Administered 2021-06-15: 25 mg via INTRAVENOUS

## 2021-06-15 MED ORDER — ACETAMINOPHEN 500 MG PO TABS
1000.0000 mg | ORAL_TABLET | Freq: Four times a day (QID) | ORAL | Status: DC
  Administered 2021-06-15 – 2021-06-16 (×5): 1000 mg via ORAL
  Filled 2021-06-15 (×5): qty 2

## 2021-06-15 MED ORDER — WITCH HAZEL-GLYCERIN EX PADS
1.0000 "application " | MEDICATED_PAD | CUTANEOUS | Status: DC | PRN
  Administered 2021-06-15: 1 via TOPICAL
  Filled 2021-06-15: qty 100

## 2021-06-15 MED ORDER — DIPHENHYDRAMINE HCL 50 MG/ML IJ SOLN
INTRAMUSCULAR | Status: AC
  Filled 2021-06-15: qty 1

## 2021-06-15 MED ORDER — PRENATAL MULTIVITAMIN CH
1.0000 | ORAL_TABLET | Freq: Every day | ORAL | Status: DC
  Administered 2021-06-16: 1 via ORAL
  Filled 2021-06-15: qty 1

## 2021-06-15 MED ORDER — BENZOCAINE-MENTHOL 20-0.5 % EX AERO
1.0000 "application " | INHALATION_SPRAY | CUTANEOUS | Status: DC | PRN
  Administered 2021-06-15: 1 via TOPICAL
  Filled 2021-06-15: qty 56

## 2021-06-15 MED ORDER — DIPHENHYDRAMINE HCL 25 MG PO CAPS: 25.0000 mg | ORAL_CAPSULE | Freq: Four times a day (QID) | ORAL | Status: DC | PRN

## 2021-06-15 MED ORDER — FENTANYL CITRATE (PF) 100 MCG/2ML IJ SOLN
INTRAMUSCULAR | Status: AC
  Filled 2021-06-15: qty 2

## 2021-06-15 MED ORDER — ONDANSETRON HCL 4 MG/2ML IJ SOLN: 4.0000 mg | INTRAMUSCULAR | Status: DC | PRN

## 2021-06-15 NOTE — H&P (Signed)
Kayleena Eke is an 25 y.o. female.   Chief Complaint: leakage of fluid HPI: She presented to labor and delivery this PM with complaints of leakage of fluid. She reports she had a large gush of fluid close to 9:15 PM. Fluid was clear. She is not feeling contractions. She has normal fetal movement. She denies vaginal bleeding.   pregnancy 1  Problems (from 11/13/20 to present)     Problem Noted Resolved   Supervision of normal pregnancy 11/13/2020 by Mirna Mires, CNM No   Overview Addendum 06/09/2021 10:42 AM by Mirna Mires, CNM     Nursing Staff Provider  Office Location  [facility] Dating   13 wk [place]  Language  English Anatomy US   normal  Flu Vaccine   Genetic Screen  Fragile-X neg, CF neg, SMA neg  NIPS: declines  TDaP vaccine   04/25/21 Hgb A1C or  GTT Early : Third trimester : 122  Covid    LAB RESULTS   Rhogam  Not needed Blood Type A/Positive/-- (04/11 1401)   Feeding Plan Considering breast Antibody Negative (04/11 1401)  Contraception Considering nexplanon Rubella 3.10 (04/11 1401)  Circumcision  RPR Non Reactive (07/14 1514)   Pediatrician   HBsAg Negative (04/11 1401)   Support Person  HIV Non Reactive (07/14 1514)  Prenatal Classes discussed Varicella  immune    GBS Negative/-- (09/19 0939)(For PCN allergy, check sensitivities)   BTL Consent   Negative   VBAC Consent  Pap  2022 ASCUS HPV neg    Hgb Electro      CF  not carrier     SMA  not carrier        Unplanned preg Tobacco use Hx of drug OD April 2021 with two weeks inpatient tx and no follow up Referred to social Work              Past Medical History:  Diagnosis Date   Seizures (HCC)     Past Surgical History:  Procedure Laterality Date   EYE SURGERY      Family History  Problem Relation Age of Onset   Hypertension Father    Social History:  reports that she has been smoking cigarettes. She has been smoking an average of .5 packs per day. She has never used smokeless tobacco. She  reports current alcohol use. She reports that she does not use drugs.  Allergies: No Known Allergies  Medications Prior to Admission  Medication Sig Dispense Refill   Prenatal Vit-Fe Fumarate-FA (PRENATAL MULTIVITAMIN) TABS tablet Take 1 tablet by mouth daily at 12 noon.      Results for orders placed or performed during the hospital encounter of 06/14/21 (from the past 48 hour(s))  CBC     Status: Abnormal   Collection Time: 06/14/21 10:46 PM  Result Value Ref Range   WBC 17.1 (H) 4.0 - 10.5 K/uL   RBC 4.16 3.87 - 5.11 MIL/uL   Hemoglobin 13.1 12.0 - 15.0 g/dL   HCT 12.8 78.6 - 76.7 %   MCV 90.9 80.0 - 100.0 fL   MCH 31.5 26.0 - 34.0 pg   MCHC 34.7 30.0 - 36.0 g/dL   RDW 20.9 47.0 - 96.2 %   Platelets 342 150 - 400 K/uL   nRBC 0.0 0.0 - 0.2 %    Comment: Performed at [facility], [address], [place], [place] [address]  Type and screen [facility]     Status: None  Collection Time: 06/14/21 10:46 PM  Result Value Ref Range   ABO/RH(D) A POS    Antibody Screen NEG    Sample Expiration      06/17/2021,2359 Performed at [facility], [address] [place], [place] [address]   ROM Plus [facility] only)     Status: None   Collection Time: 06/14/21 10:46 PM  Result Value Ref Range   Rom Plus POSITIVE     Comment: Performed at [facility], [address] [place], [place] [address]  Rapid HIV screen (HIV 1/2 Ab+Ag) ([facility] Only)     Status: None   Collection Time: 06/14/21 10:46 PM  Result Value Ref Range   HIV-1 P24 Antigen - HIV24 NON REACTIVE NON REACTIVE    Comment: (NOTE) Detection of p24 may be inhibited by biotin in the sample, causing false negative results in acute infection.    HIV 1/2 Antibodies NON REACTIVE NON REACTIVE   Interpretation (HIV Ag Ab)      A non reactive test result means that HIV 1 or HIV 2 antibodies and HIV 1 p24 antigen were not detected in the specimen.    Comment: Performed at [facility], [address] [place], [place] [address]  Resp Panel by RT-PCR (Flu A&B, Covid)     Status: None   Collection Time: 06/14/21 10:46 PM   Specimen: Nasopharyngeal(NP) swabs in vial transport medium  Result Value Ref Range   SARS Coronavirus 2 by RT PCR NEGATIVE NEGATIVE    Comment: (NOTE) SARS-CoV-2 target nucleic acids are NOT DETECTED.  The SARS-CoV-2 RNA is generally detectable in upper respiratory specimens during the acute phase of infection. The lowest concentration of SARS-CoV-2 viral copies this assay can detect is 138 copies/mL. A negative result does not preclude SARS-Cov-2 infection and should not be used as the sole basis for treatment or other patient management decisions. A negative result may occur with  improper specimen collection/handling, submission of specimen other than nasopharyngeal swab, presence of viral mutation(s) within the areas targeted by this assay, and inadequate number of viral copies(<138 copies/mL). A negative result must be combined with clinical observations, patient history, and epidemiological information. The expected result is Negative.  Fact Sheet for Patients:  BloggerCourse.com  Fact Sheet for Healthcare Providers:  SeriousBroker.it  This test is no t yet approved or cleared by the [place] FDA and  has been authorized for detection and/or diagnosis of SARS-CoV-2 by FDA under an Emergency Use Authorization (EUA). This EUA will remain  in effect (meaning this test can be used) for the duration of the COVID-19 declaration under Section 564(b)(1) of the Act, 21 U.S.C.section 360bbb-3(b)(1), unless the authorization is terminated  or revoked sooner.       Influenza A by PCR NEGATIVE NEGATIVE   Influenza B by PCR NEGATIVE NEGATIVE    Comment: (NOTE) The Xpert Xpress SARS-CoV-2/FLU/RSV plus assay is intended as an aid in the diagnosis of influenza from Nasopharyngeal  swab specimens and should not be used as a sole basis for treatment. Nasal washings and aspirates are unacceptable for Xpert Xpress SARS-CoV-2/FLU/RSV testing.  Fact Sheet for Patients: BloggerCourse.com  Fact Sheet for Healthcare Providers: SeriousBroker.it  This test is not yet approved or cleared by the [place] FDA and has been authorized for detection and/or diagnosis of SARS-CoV-2 by FDA under an Emergency Use Authorization (EUA). This EUA will remain in effect (meaning this test can be used) for the duration of the COVID-19 declaration under Section  564(b)(1) of the Act, 21 U.S.C. section 360bbb-3(b)(1), unless the authorization is terminated or revoked.  Performed at [facility], [address] [place], [place] [address]    No results found.  Review of Systems  Constitutional:  Negative for chills and fever.  HENT:  Negative for congestion, hearing loss and sinus pain.   Respiratory:  Negative for cough, shortness of breath and wheezing.   Cardiovascular:  Negative for chest pain, palpitations and leg swelling.  Gastrointestinal:  Negative for abdominal pain, constipation, diarrhea, nausea and vomiting.  Genitourinary:  Negative for dysuria, flank pain, frequency, hematuria and urgency.  Musculoskeletal:  Negative for back pain.  Skin:  Negative for rash.  Neurological:  Negative for dizziness and headaches.  Psychiatric/Behavioral:  Negative for suicidal ideas. The patient is not nervous/anxious.    Blood pressure 126/74, pulse 82, temperature 97.8 F (36.6 C), temperature source Oral, resp. rate 16, height 5\' 3"  (1.6 m), weight 78 kg, last menstrual period 10/07/2020. Physical Exam Vitals and nursing note reviewed.  Constitutional:      Appearance: Normal appearance. She is well-developed.  HENT:     Head: Normocephalic and atraumatic.  Cardiovascular:     Rate and Rhythm: Normal rate and regular  rhythm.  Pulmonary:     Effort: Pulmonary effort is normal.     Breath sounds: Normal breath sounds.  Abdominal:     General: Bowel sounds are normal.     Palpations: Abdomen is soft.  Musculoskeletal:        General: Normal range of motion.  Skin:    General: Skin is warm and dry.  Neurological:     Mental Status: She is alert and oriented to person, place, and time.  Psychiatric:        Behavior: Behavior normal.        Thought Content: Thought content normal.        Judgment: Judgment normal.    NST: 120 bpm baseline, moderate variability, 15x15 accelerations, no decelerations. Tocometer: quiet  Assessment/Plan  25 y.o. G1P0 103w6d here for PROM Will begin induction with cytotec.  Normal fetal monitoring per unit policy Reviewed option for pain management with patient. Patient does desire and epidural.  Routine admission labs GBS: negative  [redacted]w[redacted]d MD, Adelene Idler [facility], [facility] 06/15/2021 1:10 AM

## 2021-06-15 NOTE — OB Triage Note (Signed)
Pt is a G1P0 and [redacted]w[redacted]d presenting to L&D with c/o SROM at 2115. Pt said she was laying on the couch and then felt fluid leaking and stood up and there was a large gush of fluid. Pt reports fluid as clear with no odor. Pt denies ctx, VB or recent intercourse. Pt confirms positive fetal movement. Monitors applied and assessing. VSS. Support person at bedside.

## 2021-06-15 NOTE — Discharge Instructions (Signed)
Discharge Instructions:   Follow-up Appointment: 1 week, please call the office to schedule  If there are any new medications, they have been ordered and will be available for pickup at the listed pharmacy on your way home from the hospital.   Call the office if you have any of the following: headache, visual changes, fever >101.0 F, chills, shortness of breath, breast concerns, excessive vaginal bleeding, incision drainage or problems, leg pain or redness, depression or any other concerns. If you have vaginal discharge with an odor, let your doctor know.   It is normal to bleed for up to 6 weeks. You should not soak through more than 1 pad in 1 hour. If you have a blood clot larger than your fist with continued bleeding, call your doctor.   Activity: Do not lift > 15 lbs for 6 weeks (do not lift anything heavier than your baby). No intercourse, tampons, swimming pools, hot tubs, baths (only showers) for 6 weeks.  No driving for 1-2 weeks. Do not drive while taking narcotic or opioid pain medication.  Continue taking your prenatal vitamin, especially if breastfeeding. Increase calories and fluids (water) while breastfeeding.   Your milk will come in, in the next couple of days (right now it is colostrum). You may have a slight fever when your milk comes in, but it should go away on its own.  If it does not, and rises above 101 F please call the doctor. You will also feel achy and your breasts will be firm. They will also start to leak. If you are breastfeeding, continue as you have been and you can pump/express milk for comfort.   If you have too much milk, your breasts can become engorged, which could lead to mastitis. This is an infection of the milk ducts. It can be very painful and you will need to notify your doctor to obtain a prescription for antibiotics. You can also treat it with a shower or hot/cold compress.   For concerns about your baby, please call your pediatrician.  For  breastfeeding concerns, the lactation consultant can be reached at [phone].   Postpartum blues (feelings of happy one minute and sad another minute) are normal for the first few weeks but if it gets worse let your doctor know.   Congratulations! We enjoyed caring for you and your new bundle of joy!   

## 2021-06-15 NOTE — Anesthesia Preprocedure Evaluation (Signed)
Anesthesia Evaluation  Patient identified by MRN, date of birth, ID band Patient awake    History of Anesthesia Complications Negative for: history of anesthetic complications  Airway Mallampati: II  TM Distance: >3 FB Neck ROM: Full    Dental no notable dental hx.    Pulmonary neg pulmonary ROS, Current Smoker,    Pulmonary exam normal        Cardiovascular negative cardio ROS Normal cardiovascular exam     Neuro/Psych negative neurological ROS     GI/Hepatic negative GI ROS, Neg liver ROS,   Endo/Other  negative endocrine ROS  Renal/GU   negative genitourinary   Musculoskeletal negative musculoskeletal ROS (+)   Abdominal   Peds negative pediatric ROS (+)  Hematology negative hematology ROS (+)   Anesthesia Other Findings   Reproductive/Obstetrics (+) Pregnancy                             Anesthesia Physical Anesthesia Plan  ASA: 1  Anesthesia Plan: Epidural   Post-op Pain Management:    Induction:   PONV Risk Score and Plan:   Airway Management Planned:   Additional Equipment:   Intra-op Plan:   Post-operative Plan:   Informed Consent: I have reviewed the patients History and Physical, chart, labs and discussed the procedure including the risks, benefits and alternatives for the proposed anesthesia with the patient or authorized representative who has indicated his/her understanding and acceptance.       Plan Discussed with:   Anesthesia Plan Comments:         Anesthesia Quick Evaluation

## 2021-06-15 NOTE — Anesthesia Procedure Notes (Signed)
Epidural Patient location during procedure: OB Start time: 06/15/2021 4:03 AM  Staffing Anesthesiologist: Gwendolyn Fill, MD Performed: anesthesiologist   Preanesthetic Checklist Completed: patient identified, IV checked, site marked, risks and benefits discussed, surgical consent, monitors and equipment checked, pre-op evaluation and timeout performed  Epidural Patient position: sitting Prep: Betadine Patient monitoring: heart rate, cardiac monitor, continuous pulse ox and blood pressure Approach: midline Location: L3-L4 Injection technique: LOR saline  Needle:  Needle type: Tuohy  Needle gauge: 18 G Needle length: 9 cm Needle insertion depth: 5 cm Catheter size: 20 Guage Catheter at skin depth: 9 cm Test dose: 1.5% lidocaine with Epi 1:200 K  Assessment Sensory level: T9  Additional Notes Catheter in easily, patient tolerated procedure well

## 2021-06-15 NOTE — Discharge Summary (Signed)
Postpartum Discharge Summary   Patient Name: Jacqueline Krueger DOB: 01/15/1996 MRN: [id number]  Date of admission: 06/14/2021 Delivery date:06/15/2021  Delivering provider: Adrian Prows R  Date of discharge: 06/16/2021  Admitting diagnosis: ROM (rupture of membranes), premature [O42.90] Intrauterine pregnancy: [redacted]w[redacted]d    Secondary diagnosis:  Active Problems:   ROM (rupture of membranes), premature  Additional problems: none    Discharge diagnosis: Term Pregnancy Delivered                                              Post partum procedures: none Augmentation: Pitocin and Cytotec Complications: None  Hospital course: Induction of Labor With Vaginal Delivery   25y.o. yo G1P0 at 362w6das admitted to the hospital 06/14/2021 for induction of labor.  Indication for induction: PROM.  Patient had an uncomplicated labor course as follows: Membrane Rupture Time/Date: 9:15 PM ,06/14/2021   Delivery Method:Vaginal, Spontaneous  Episiotomy: None  Lacerations:  2nd degree;Perineal;Periurethral  Details of delivery can be found in separate delivery note.  Patient had a routine postpartum course. Patient is discharged home 06/16/21.  Newborn Data: Birth date:06/15/2021  Birth time:1:52 PM  Gender:Female  Living status:Living  Apgars:7 ,9  Weight:2722 g   Magnesium Sulfate received: No BMZ received: No Rhophylac:No MMR:No T-DaP:Given prenatally Flu: No Transfusion:No  Physical exam  Vitals:   06/15/21 1930 06/15/21 2330 06/16/21 0340 06/16/21 0834  BP: 107/67 101/62 103/62 99/71  Pulse: 77 61 76 67  Resp: _0 Temp: 98.4 F (36.9 C) (!) 97.4 F (36.3 C) 97.9 F (36.6 C) 98.3 F (36.8 C)  TempSrc: Oral Oral Oral Oral  SpO2: 98% 98% 98% 98%  Weight:      Height:       General: alert, cooperative, and no distress Lochia: appropriate Uterine Fundus: firm DVT Evaluation: No evidence of DVT seen on physical exam. Labs: Lab Results  Component Value Date   WBC  17.1 (H) 06/16/2021   HGB 11.2 (L) 06/16/2021   HCT 32.2 (L) 06/16/2021   MCV 93.3 06/16/2021   PLT 283 06/16/2021   CMP Latest Ref Rng & Units 11/19/2018  Glucose 70 - 99 mg/dL 101(H)  BUN 6 - 20 mg/dL 14  Creatinine 0.44 - 1.00 mg/dL 0.69  Sodium 135 - 145 mmol/L 139  Potassium 3.5 - 5.1 mmol/L 3.9  Chloride 98 - 111 mmol/L 105  CO2 22 - 32 mmol/L 25  Calcium 8.9 - 10.3 mg/dL 9.0  Total Protein 6.5 - 8.1 g/dL 7.6  Total Bilirubin 0.3 - 1.2 mg/dL 0.5  Alkaline Phos 38 - 126 U/L 31(L)  AST 15 - 41 U/L 17  ALT 0 - 44 U/L 16   Edinburgh Score: Edinburgh Postnatal Depression Scale Screening Tool 06/15/2021  I have been able to laugh and see the funny side of things. 0  I have looked forward with enjoyment to things. 1  I have blamed myself unnecessarily when things went wrong. 3  I have been anxious or worried for no good reason. 2  I have felt scared or panicky for no good reason. 1  Things have been getting on top of me. 1  I have been so unhappy that I have had difficulty sleeping. 1  I have felt sad or miserable. 1  I have been so unhappy that I have been crying. 1  The thought of harming myself has occurred to me. 1  Edinburgh Postnatal Depression Scale Total 12      After visit meds:  Allergies as of 06/16/2021   No Known Allergies      Medication List     TAKE these medications    ibuprofen 600 MG tablet Commonly known as: ADVIL Take 1 tablet (600 mg total) by mouth every 6 (six) hours.   prenatal multivitamin Tabs tablet Take 1 tablet by mouth daily at 12 noon.         Discharge home in stable condition Infant Feeding: Bottle Infant Disposition:home with mother Discharge instruction: per After Visit Summary and Postpartum booklet. Activity: Advance as tolerated. Pelvic rest for 6 weeks.  Diet: routine diet Anticipated Birth Control: Nexplanon Postpartum Appointment:2 weeks Additional Postpartum F/U: Postpartum Depression checkup Future  Appointments: Future Appointments  Date Time Provider [place]  07/28/2021  9:30 AM Schuman, Stefanie Libel, MD [facility] None   Follow up Visit:  Follow-up Information     Schuman, Christanna R, MD Follow up in 2 week(s).   Specialty: Obstetrics and Gynecology Contact information: [place] [address] [phone]                     06/16/2021 Malachy Mood, MD

## 2021-06-16 ENCOUNTER — Encounter: Payer: BC Managed Care – PPO | Admitting: Obstetrics

## 2021-06-16 LAB — CBC
HCT: 32.2 % — ABNORMAL LOW (ref 36.0–46.0)
Hemoglobin: 11.2 g/dL — ABNORMAL LOW (ref 12.0–15.0)
MCH: 32.5 pg (ref 26.0–34.0)
MCHC: 34.8 g/dL (ref 30.0–36.0)
MCV: 93.3 fL (ref 80.0–100.0)
Platelets: 283 10*3/uL (ref 150–400)
RBC: 3.45 MIL/uL — ABNORMAL LOW (ref 3.87–5.11)
RDW: 13.2 % (ref 11.5–15.5)
WBC: 17.1 10*3/uL — ABNORMAL HIGH (ref 4.0–10.5)
nRBC: 0 % (ref 0.0–0.2)

## 2021-06-16 MED ORDER — IBUPROFEN 600 MG PO TABS: 600.0000 mg | ORAL_TABLET | Freq: Four times a day (QID) | ORAL | 0 refills | Status: DC

## 2021-06-16 NOTE — TOC Transition Note (Signed)
Transition of Care [facility]) - CM/SW Discharge Note   Patient Details  Name: Jacqueline Krueger MRN: [id number] Date of Birth: May 09, 1996  [facility] CM/SW Contact:  Marina Goodell Phone Number: [phone] 06/16/2021, 3:43 PM   Clinical Narrative:     CSW spoke with patient.  Patient stated she has family supports and will stay home with the baby for the foreseeable future. Patient stated her mother is assisting her in finding a pediatrician for the baby and getting Medicaid, as well as contacting the health department to apply for [facility].  CSW discussed symptom of anxiety and depression and encouraged patient to contact family members friends, or providers if she is experiencing these symptoms.  Patient verbalized understanding and had no other questions or concerns.        Patient Goals and CMS Choice        Discharge Placement                       Discharge Plan and Services                                     Social Determinants of Health (SDOH) Interventions     Readmission Risk Interventions No flowsheet data found.

## 2021-06-16 NOTE — Anesthesia Postprocedure Evaluation (Signed)
Anesthesia Post Note  Patient: Rosi Secrist  Procedure(s) Performed: AN AD HOC LABOR EPIDURAL  Patient location during evaluation: Mother Baby Anesthesia Type: Epidural Level of consciousness: awake and alert Pain management: pain level controlled Vital Signs Assessment: post-procedure vital signs reviewed and stable Respiratory status: spontaneous breathing, nonlabored ventilation and respiratory function stable Cardiovascular status: stable Postop Assessment: no headache, no backache and epidural receding Anesthetic complications: no   No notable events documented.   Last Vitals:  Vitals:   06/15/21 2330 06/16/21 0340  BP: 101/62 103/62  Pulse: 61 76  Resp: 18 18  Temp: (!) 36.3 C 36.6 C  SpO2: 98% 98%    Last Pain:  Vitals:   06/16/21 0340  TempSrc: Oral  PainSc:                  Lynden Oxford

## 2021-06-16 NOTE — TOC Initial Note (Addendum)
Transition of Care [facility]) - Initial/Assessment Note    Patient Details  Name: Jacqueline Krueger MRN: [id number] Date of Birth: September 25, 1995  Transition of Care [facility]) CM/SW Contact:    Lozano Cellar, RN Phone Number: 06/16/2021, 2:27 PM  Clinical Narrative:                 LVMM x2 @ [phone] to speak with patient prior to discharge.   No answer into room phone.         Patient Goals and CMS Choice        Expected Discharge Plan and Services           Expected Discharge Date: 06/16/21                                    Prior Living Arrangements/Services                       Activities of Daily Living Home Assistive Devices/Equipment: None ADL Screening (condition at time of admission) Patient's cognitive ability adequate to safely complete daily activities?: Yes Is the patient deaf or have difficulty hearing?: No Does the patient have difficulty seeing, even when wearing glasses/contacts?: Yes Does the patient have difficulty concentrating, remembering, or making decisions?: No Patient able to express need for assistance with ADLs?: Yes Does the patient have difficulty dressing or bathing?: No Independently performs ADLs?: Yes (appropriate for developmental age) Does the patient have difficulty walking or climbing stairs?: No Weakness of Legs: None Weakness of Arms/Hands: None  Permission Sought/Granted                  Emotional Assessment              Admission diagnosis:  ROM (rupture of membranes), premature [O42.90] Patient Active Problem List   Diagnosis Date Noted   ROM (rupture of membranes), premature 06/14/2021   Anxiety 01/06/2021   Supervision of normal pregnancy 11/13/2020   Overdose, intentional self-harm, initial encounter (HCC) 12/30/2019   Seizure disorder (HCC) 05/17/2015   Depression 05/04/2013   PCP:  [facility]:   [facility] [phone] - [place], [place] - [address] [place] [address] Phone: [phone] Fax: [phone]     Social Determinants of Health (SDOH) Interventions    Readmission Risk Interventions No flowsheet data found.

## 2021-06-16 NOTE — Progress Notes (Signed)
Pt d/c home in the company of mother. D/c instructions reviewed with mother and she verbalized understanding. Pt stated she will add the FOB to birth certificate outpatient. IV d/c'd with catheter intact

## 2021-07-28 ENCOUNTER — Encounter: Payer: Self-pay | Admitting: Obstetrics and Gynecology

## 2021-07-28 ENCOUNTER — Other Ambulatory Visit: Payer: Self-pay

## 2021-07-28 ENCOUNTER — Ambulatory Visit (INDEPENDENT_AMBULATORY_CARE_PROVIDER_SITE_OTHER): Payer: BC Managed Care – PPO | Admitting: Obstetrics and Gynecology

## 2021-07-28 VITALS — BP 120/70 | Ht 63.0 in | Wt 155.6 lb

## 2021-07-28 DIAGNOSIS — Z30017 Encounter for initial prescription of implantable subdermal contraceptive: Secondary | ICD-10-CM | POA: Diagnosis not present

## 2021-07-28 DIAGNOSIS — Z4889 Encounter for other specified surgical aftercare: Secondary | ICD-10-CM | POA: Diagnosis not present

## 2021-07-28 DIAGNOSIS — Z3040 Encounter for surveillance of contraceptives, unspecified: Secondary | ICD-10-CM

## 2021-07-28 LAB — POCT URINE PREGNANCY: Preg Test, Ur: NEGATIVE

## 2021-07-28 NOTE — Patient Instructions (Signed)
Etonogestrel Implant What is this medication? ETONOGESTREL (et oh noe JES trel) prevents ovulation and pregnancy. It belongs to a group of medications called contraceptives. This medication is a progestin hormone. This medicine may be used for other purposes; ask your health care provider or pharmacist if you have questions. COMMON BRAND NAME(S): Implanon, Nexplanon What should I tell my care team before I take this medication? They need to know if you have any of these conditions: Abnormal vaginal bleeding Blood vessel disease or blood clots Breast, cervical, endometrial, ovarian, liver, or uterine cancer Diabetes Gallbladder disease Heart disease or recent heart attack High blood pressure High cholesterol or triglycerides Kidney disease Liver disease Migraine headaches Seizures Stroke Tobacco smoker An unusual or allergic reaction to etonogestrel, anesthetics or antiseptics, other medications, foods, dyes, or preservatives Pregnant or trying to get pregnant Breast-feeding How should I use this medication? This device is inserted just under the skin on the inner side of your upper arm by your care team. Talk to your care team about the use of this medication in children. Special care may be needed. Overdosage: If you think you have taken too much of this medicine contact a poison control center or emergency room at once. NOTE: This medicine is only for you. Do not share this medicine with others. What if I miss a dose? This does not apply. What may interact with this medication? Do not take this medication with any of the following: Amprenavir Fosamprenavir This medication may also interact with the following: Acitretin Aprepitant Armodafinil Bexarotene Bosentan Carbamazepine Certain medications for fungal infections like fluconazole, ketoconazole, itraconazole and voriconazole Certain medications to treat hepatitis, HIV or  AIDS Cyclosporine Felbamate Griseofulvin Lamotrigine Modafinil Oxcarbazepine Phenobarbital Phenytoin Primidone Rifabutin Rifampin Rifapentine St. John's wort Topiramate This list may not describe all possible interactions. Give your health care provider a list of all the medicines, herbs, non-prescription drugs, or dietary supplements you use. Also tell them if you smoke, drink alcohol, or use illegal drugs. Some items may interact with your medicine. What should I watch for while using this medication? This product does not protect you against HIV infection (AIDS) or other sexually transmitted diseases. You should be able to feel the implant by pressing your fingertips over the skin where it was inserted. Contact your care team if you cannot feel the implant, and use a non-hormonal birth control method (such as condoms) until your care team confirms that the implant is in place. Contact your care team if you think that the implant may have broken or become bent while in your arm. You will receive a user card from your care team after the implant is inserted. The card is a record of the location of the implant in your upper arm and when it should be removed. Keep this card with your health records. What side effects may I notice from receiving this medication? Side effects that you should report to your care team as soon as possible: Allergic reactions--skin rash, itching, hives, swelling of the face, lips, tongue, or throat Blood clot--pain, swelling, or warmth in the leg, shortness of breath, chest pain Gallbladder problems--severe stomach pain, nausea, vomiting, fever Increase in blood pressure Liver injury--right upper belly pain, loss of appetite, nausea, light-colored stool, dark yellow or brown urine, yellowing skin or eyes, unusual weakness or fatigue New or worsening migraines or headaches Pain, redness, or irritation at injection site Stroke--sudden numbness or weakness of the  face, arm, or leg, trouble speaking, confusion, trouble walking, loss   of balance or coordination, dizziness, severe headache, change in vision Unusual vaginal discharge, itching, or odor Worsening mood, feelings of depression Side effects that usually do not require medical attention (report to your care team if they continue or are bothersome): Breast pain or tenderness Dark patches of skin on the face or other sun-exposed areas Irregular menstrual cycles or spotting Nausea Weight gain This list may not describe all possible side effects. Call your doctor for medical advice about side effects. You may report side effects to FDA at 1-800-FDA-1088. Where should I keep my medication? This medication is given in a hospital or clinic and will not be stored at home. NOTE: This sheet is a summary. It may not cover all possible information. If you have questions about this medicine, talk to your doctor, pharmacist, or health care provider.  2022 Elsevier/Gold Standard (2021-05-20 00:00:00)

## 2021-07-28 NOTE — Progress Notes (Signed)
Postpartum Visit  Chief Complaint:  Chief Complaint  Patient presents with   Procedure    History of Present Illness: Patient is a 25 y.o. G1P1001 presents for postpartum visit.  Date of delivery: 06/15/2021 Type of delivery: vaginal Laceration: 2nd degree and periurethral  Pregnancy or labor problems: no  Breast Feeding:  no Lochia:  normal Post partum depression/anxiety noted:  no Edinburgh Post-Partum Depression Score: 22- patient denies mood concerns  Date of last PAP: 11/2020 ASCUS HPV neg  Any problems since the delivery:  no  She reports she has been feeling well. Infant is doing well.  Newborn Details:  SINGLETON :  1. Baby Gender:female.  Infant Status: Infant doing well at home with mother.   Review of Systems: ROS  Past Medical History:  Past Medical History:  Diagnosis Date   Seizures (HCC)     Past Surgical History:  Past Surgical History:  Procedure Laterality Date   EYE SURGERY      Family History:  Family History  Problem Relation Age of Onset   Hypertension Father     Social History:  Social History   Socioeconomic History   Marital status: Single    Spouse name: Not on file   Number of children: Not on file   Years of education: Not on file   Highest education level: Not on file  Occupational History   Not on file  Tobacco Use   Smoking status: Every Day    Packs/day: 0.50    Types: Cigarettes   Smokeless tobacco: Never  Vaping Use   Vaping Use: Never used  Substance and Sexual Activity   Alcohol use: Yes    Comment: occasionally (not during pregnancy per pt)   Drug use: No   Sexual activity: Yes    Birth control/protection: Implant  Other Topics Concern   Not on file  Social History Narrative   Not on file   Social Determinants of Health   Financial Resource Strain: Not on file  Food Insecurity: Not on file  Transportation Needs: Not on file  Physical Activity: Not on file  Stress: Not on file  Social  Connections: Not on file  Intimate Partner Violence: Not on file    Allergies:  No Known Allergies  Medications: Prior to Admission medications   Medication Sig Start Date End Date Taking? Authorizing Provider  ibuprofen (ADVIL) 600 MG tablet Take 1 tablet (600 mg total) by mouth every 6 (six) hours. Patient not taking: Reported on 07/28/2021 06/16/21   Vena Austria, MD  Prenatal Vit-Fe Fumarate-FA (PRENATAL MULTIVITAMIN) TABS tablet Take 1 tablet by mouth daily at 12 noon. Patient not taking: Reported on 07/28/2021    [provider]  venlafaxine (EFFEXOR) 37.5 MG tablet Take 37.5 mg by mouth daily. 11/16/18 06/12/19  [provider]    Physical Exam Vitals:  Vitals:   07/28/21 0919  BP: 120/70    Physical Exam Constitutional:      Appearance: Normal appearance. She is well-developed.  Genitourinary:     Genitourinary Comments: External: Normal appearing vulva. No lesions noted.  Well healed laceration  HENT:     Head: Normocephalic and atraumatic.  Eyes:     Extraocular Movements: Extraocular movements intact.     Pupils: Pupils are equal, round, and reactive to light.  Neck:     Thyroid: No thyromegaly.  Cardiovascular:     Rate and Rhythm: Normal rate and regular rhythm.     Heart sounds: Normal heart sounds.  Pulmonary:     Effort: Pulmonary effort is normal.     Breath sounds: Normal breath sounds.  Abdominal:     General: Bowel sounds are normal. There is no distension.     Palpations: Abdomen is soft. There is no mass.  Musculoskeletal:     Cervical back: Neck supple.  Neurological:     Mental Status: She is alert and oriented to person, place, and time.  Skin:    General: Skin is warm and dry.  Psychiatric:        Behavior: Behavior normal.        Thought Content: Thought content normal.        Judgment: Judgment normal.  Vitals reviewed.      GYNECOLOGY PROCEDURE NOTE  Patient is a 25 y.o. G1P1001 presenting for Nexplanon  insertion as her desires means of contraception.  She provided informed consent, signed copy in the chart, time out was performed. Pregnancy test was negatinve, with self reported LMP of Patient's last menstrual period was 07/20/2021.  She understands that Nexplanon is a progesterone only therapy, and that patients often patients have irregular and unpredictable vaginal bleeding or amenorrhea. She understands that other side effects are possible related to systemic progesterone, including but not limited to, headaches, breast tenderness, nausea, and irritability. While effective at preventing pregnancy long acting reversible contraceptives do not prevent transmission of sexually transmitted diseases and use of barrier methods for this purpose was discussed. The placement procedure for Nexplanon was reviewed with the patient in detail including risks of nerve injury, infection, bleeding and injury to other muscles or tendons. She understands that the Nexplanon implant is good for 3 years and needs to be removed at the end of that time.  She understands that Nexplanon is an extremely effective option for contraception, with failure rate of <1%. This information is reviewed today and all questions were answered. Informed consent was obtained, both verbally and written.   The patient is healthy and has no contraindications to Nexplanon use. Urine pregnancy test was performed today and was negative.  Procedure Appropriate time out taken.  Patient placed in dorsal supine with left arm above head, elbow flexed at 90 degrees, arm resting on examination table.  The bicipital grove was palpated and site 8-10cm proximal to the medial epicondyle and 3-5 cm below the bicipital grove was indentified . The insertion site was prepped with a two betadine swabs and then injected with 3 cc of 1 % lidocaine without epinephrine.  Nexplanon removed form sterile blister packaging,  Device confirmed in needle, before inserting full  length of needle, tenting up the skin as the needle was advance.  The drug eluting rod was then deployed by pulling back the slider per the manufactures recommendation.  The implant was palpable by the clinician as well as the patient.  The insertion site covered dressed with a steristrip and band aid before applying  a kerlex bandage pressure dressing. Minimal blood loss was noted during the procedure.  The patientt tolerated the procedure well.   She was instructed to wear the bandage for 24 hours, call with any signs of infection.  She was given the Nexplanon card and instructed to have the rod removed in 3 years.  Adelene Idler MD, [facility], [facility] 07/28/2021 10:05 AM   Assessment: 25 y.o. G1P1001 presenting for 6 week postpartum visit  Plan: Problem List Items Addressed This Visit   None Visit Diagnoses     Encounter  for postoperative care    -  Primary   Encounter for surveillance of contraceptive device       Relevant Orders   POCT urine pregnancy (Completed)   Nexplanon insertion           1) Contraception-  Nexplanon inserted today   2)  Pap: up to date  3) Patient underwent screening for postpartum depression with no concerns noted.  - Follow up in 1 year   Adelene Idler MD, [facility], [facility] 07/28/2021 10:05 AM

## 2022-02-04 IMAGING — US US OB COMP +14 WK
1 series · 13 of 28 positions shown · non-contrast
Comparison: none

CLINICAL DATA: Pregnancy.  Assess fetal anatomy

EXAM:
OBSTETRICAL ULTRASOUND >14 WKS

[Series 1: us ob comp + 14 wk · 80 acquisitions, 13 frames shown]
[im 3/80]
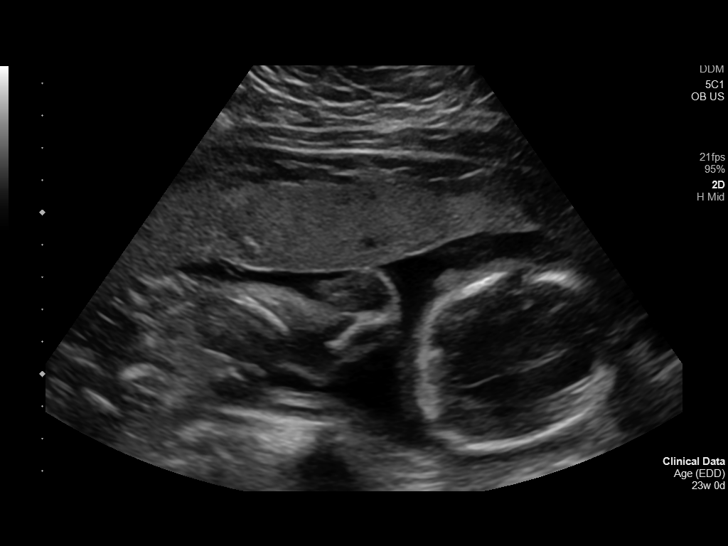
[im 9/80]
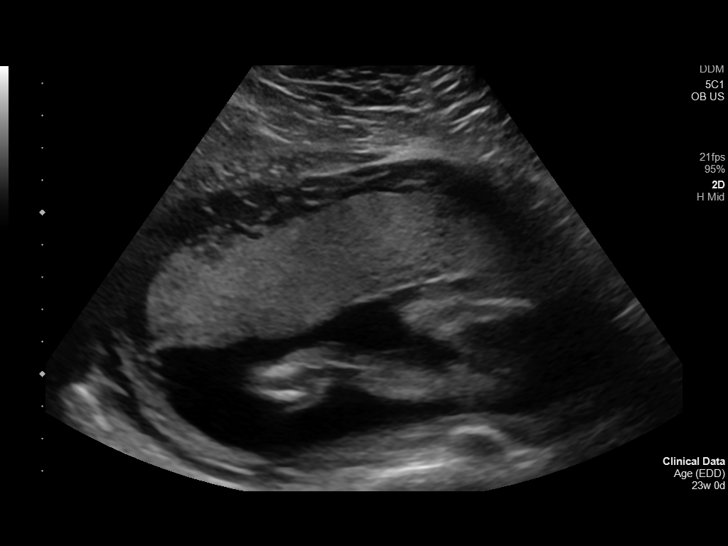
[im 15/80]
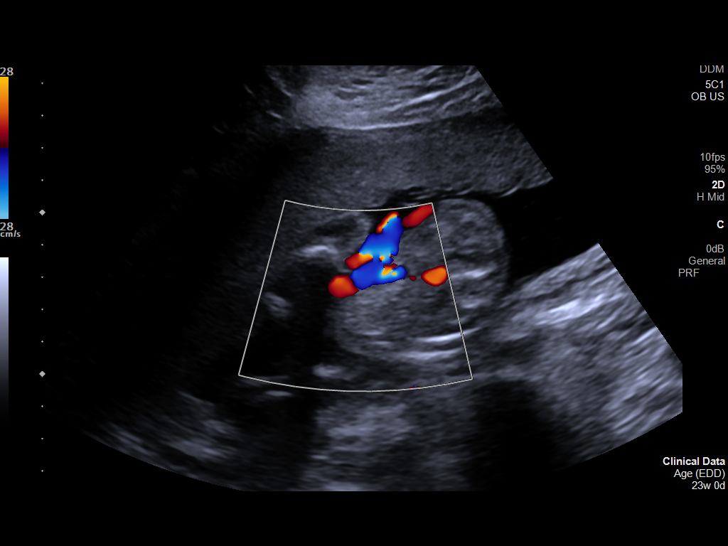
[im 21/80]
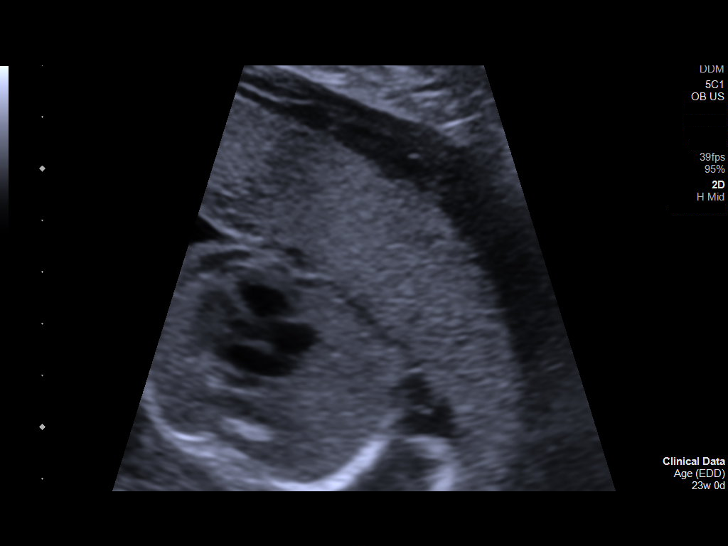
[im 27/80]
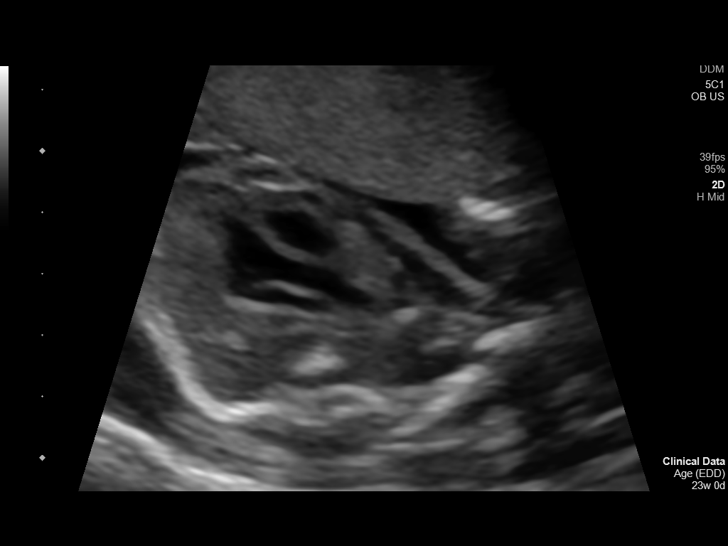
[im 33/80]
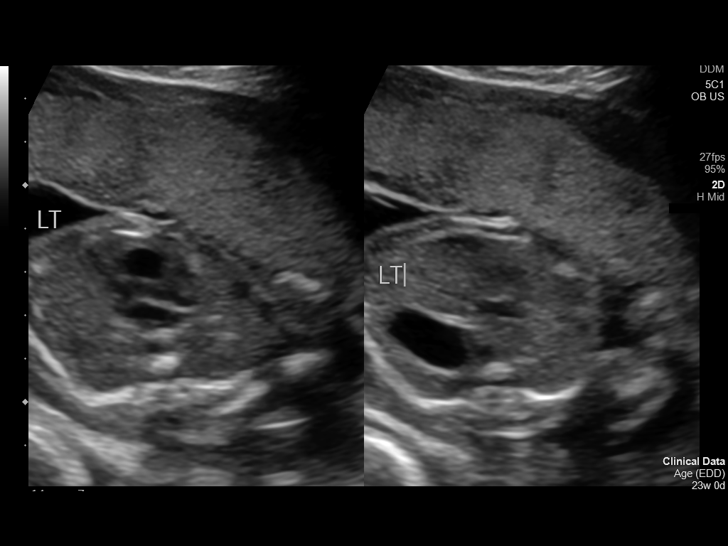
[im 41/80]
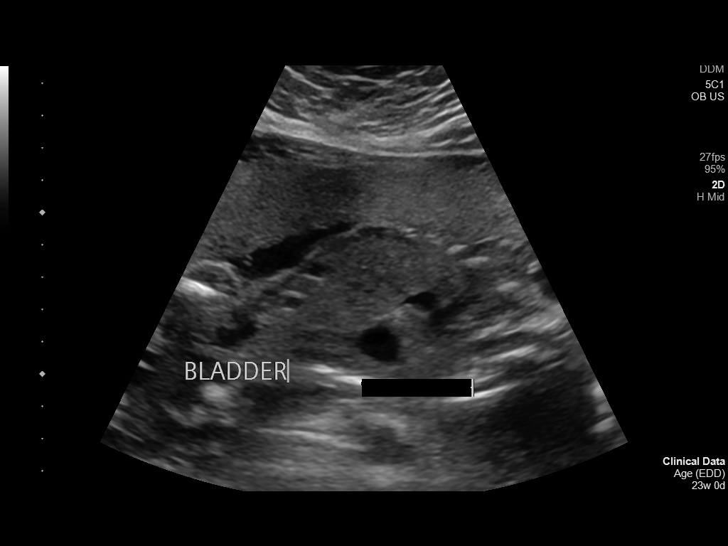
[im 47/80]
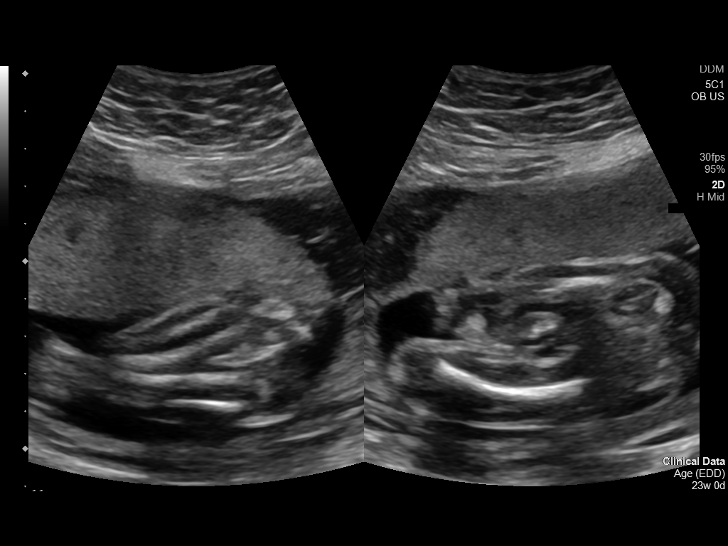
[im 53/80]
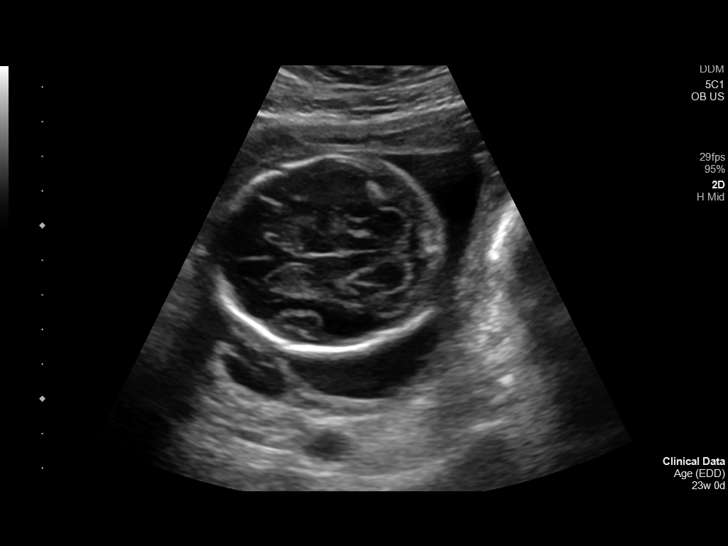
[im 59/80]
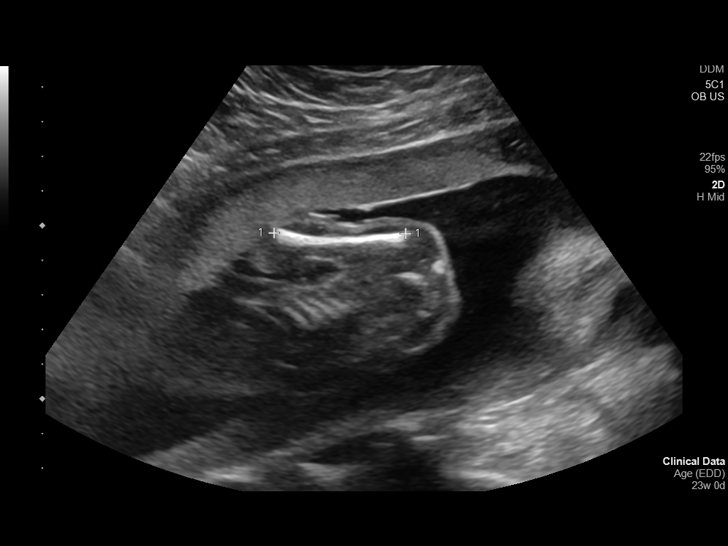
[im 65/80]
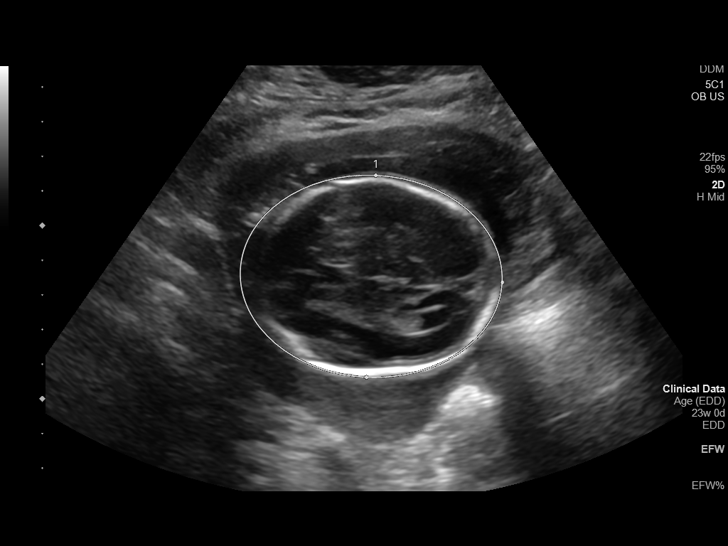
[im 71/80]
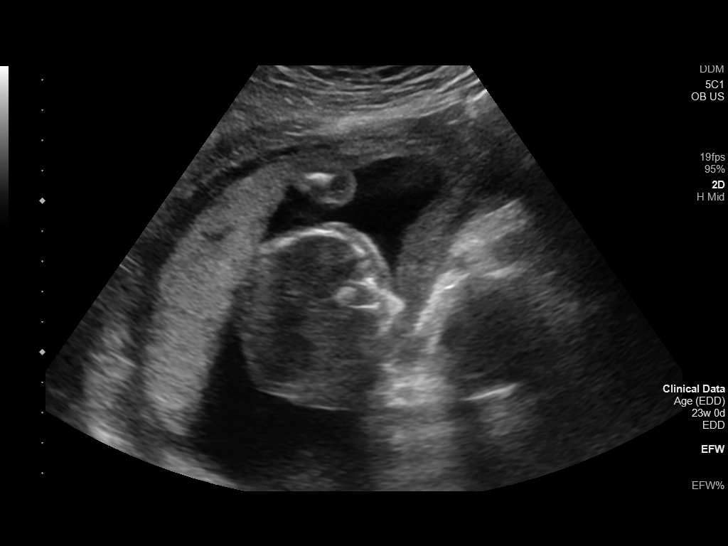
[im 77/80]
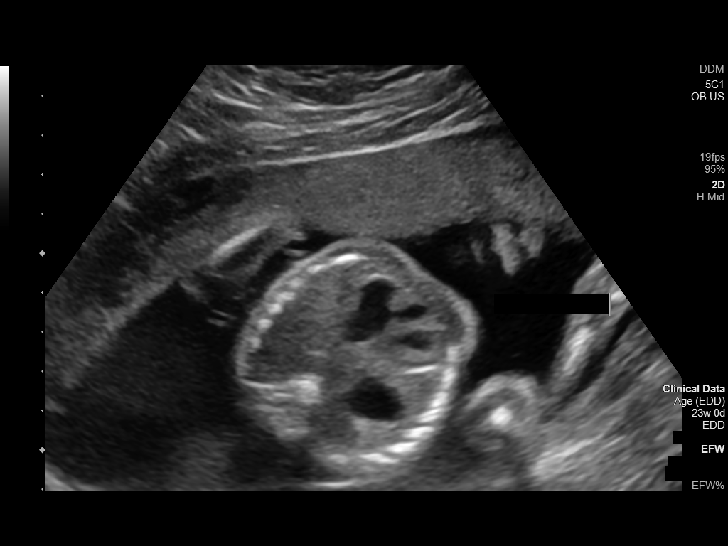

[13 of 28 positions shown; findings below may reference images not displayed]

FINDINGS: Number of Fetuses: 1

Heart Rate:  162 bpm

Movement: Yes

Presentation: Cephalic

Previa: No

Placental Location: Anterior

Amniotic Fluid (Subjective): Normal

Amniotic Fluid (Objective):

Vertical pocket = 5.5cm

FETAL BIOMETRY

BPD: 5.6cm 23w 0d

HC:   21.2cm 23w 2d

AC:   17.6cm 22w 3d

FL:   4.0cm 23w 0d

Current Mean GA: 23w 0d US EDC: 06/23/2021

Assigned GA:  23w 0d Assigned EDC: 06/23/2021

Estimated Fetal Weight:  532g 31%ile

FETAL ANATOMY

Lateral Ventricles: Appears normal

Thalami/CSP: Appears normal

Posterior Fossa:  Appears normal

Nuchal Region: Appears normal   NFT= N/A > 20 WKS

Upper Lip: Appears normal

Spine: Not well visualized

4 Chamber Heart on Left: Appears normal

LVOT: Appears normal

RVOT: Appears normal

Stomach on Left: Appears normal

3 Vessel Cord: Appears normal

Cord Insertion site: Appears normal

Kidneys: Appears normal

Bladder: Appears normal

Extremities: Appears normal

Technically difficult due to: Fetal positioning

Maternal Findings:

Cervix:  Closed.  3.3 cm.
IMPRESSION: 1. Single live intrauterine gestation in cephalic presentation.
2. Assigned gestational age of 23 weeks 0 days. Adequate interval
growth.
3. Limited evaluation of the fetal spine.  Attention on follow-up.
4. The remainder of the fetal anatomic survey is within normal
limits. No fetal anomalies detected.

## 2022-08-02 ENCOUNTER — Ambulatory Visit: Payer: Self-pay

## 2022-08-03 ENCOUNTER — Ambulatory Visit
Admission: RE | Admit: 2022-08-03 | Discharge: 2022-08-03 | Disposition: A | Discharge status: Home / Self Care | Payer: Medicaid Other | Source: Ambulatory Visit

## 2022-08-03 VITALS — BP 114/71 | HR 83 | Resp 18 | Ht 63.0 in | Wt 150.0 lb

## 2022-08-03 DIAGNOSIS — G44209 Tension-type headache, unspecified, not intractable: Secondary | ICD-10-CM

## 2022-08-03 DIAGNOSIS — R2990 Unspecified symptoms and signs involving the nervous system: Secondary | ICD-10-CM

## 2022-08-03 DIAGNOSIS — E010 Iodine-deficiency related diffuse (endemic) goiter: Secondary | ICD-10-CM

## 2022-08-03 DIAGNOSIS — Z87898 Personal history of other specified conditions: Secondary | ICD-10-CM

## 2022-08-03 DIAGNOSIS — R253 Fasciculation: Secondary | ICD-10-CM

## 2022-08-03 LAB — CBC WITH DIFFERENTIAL/PLATELET
Abs Immature Granulocytes: 0.02 10*3/uL (ref 0.00–0.07)
Basophils Absolute: 0.1 10*3/uL (ref 0.0–0.1)
Basophils Relative: 1 %
Eosinophils Absolute: 0.1 10*3/uL (ref 0.0–0.5)
Eosinophils Relative: 1 %
HCT: 44.7 % (ref 36.0–46.0)
Hemoglobin: 14.9 g/dL (ref 12.0–15.0)
Immature Granulocytes: 0 %
Lymphocytes Relative: 23 %
Lymphs Abs: 2 10*3/uL (ref 0.7–4.0)
MCH: 30.6 pg (ref 26.0–34.0)
MCHC: 33.3 g/dL (ref 30.0–36.0)
MCV: 91.8 fL (ref 80.0–100.0)
Monocytes Absolute: 0.4 10*3/uL (ref 0.1–1.0)
Monocytes Relative: 5 %
Neutro Abs: 6.3 10*3/uL (ref 1.7–7.7)
Neutrophils Relative %: 70 %
Platelets: 338 10*3/uL (ref 150–400)
RBC: 4.87 MIL/uL (ref 3.87–5.11)
RDW: 13 % (ref 11.5–15.5)
WBC: 8.9 10*3/uL (ref 4.0–10.5)
nRBC: 0 % (ref 0.0–0.2)

## 2022-08-03 LAB — TSH: TSH: 1.413 u[IU]/mL (ref 0.350–4.500)

## 2022-08-03 LAB — COMPREHENSIVE METABOLIC PANEL
ALT: 13 U/L (ref 0–44)
AST: 12 U/L — ABNORMAL LOW (ref 15–41)
Albumin: 4.4 g/dL (ref 3.5–5.0)
Alkaline Phosphatase: 45 U/L (ref 38–126)
Anion gap: 4 — ABNORMAL LOW (ref 5–15)
BUN: 13 mg/dL (ref 6–20)
CO2: 26 mmol/L (ref 22–32)
Calcium: 9.3 mg/dL (ref 8.9–10.3)
Chloride: 107 mmol/L (ref 98–111)
Creatinine, Ser: 0.74 mg/dL (ref 0.44–1.00)
GFR, Estimated: >60 mL/min (ref >60)
Glucose, Bld: 91 mg/dL (ref 70–99)
Potassium: 4.3 mmol/L (ref 3.5–5.1)
Sodium: 137 mmol/L (ref 135–145)
Total Bilirubin: 0.4 mg/dL (ref 0.3–1.2)
Total Protein: 7.7 g/dL (ref 6.5–8.1)

## 2022-08-03 LAB — T4, FREE: Free T4: 0.84 ng/dL (ref 0.61–1.12)

## 2022-08-03 LAB — MAGNESIUM: Magnesium: 2 mg/dL (ref 1.7–2.4)

## 2022-08-03 NOTE — ED Triage Notes (Signed)
Pt states that she was diagnosed with Vertigo but it has been happening more frequently - 2-3 times a week for the last 2-37months.   Pt states that the arm and leg spasms have been present for a few years but have increased in the last 3 months.  Pt has had increased migraines and is having them 2 times weekly and last one was last night.  Pt states that she is under new stress and recently got a new job 7 months ago.   Pt states that the spasms are not painful and she will twitch and lose control of the afflicted limb.

## 2022-08-03 NOTE — ED Provider Notes (Signed)
[facility]    CSN: [id number] Arrival date & time: 08/03/22  1008      History   Chief Complaint Chief Complaint  Patient presents with   Headache   Spasms   Dizziness    HPI Jacqueline Krueger is a 26 y.o. female who presents needing a note for work because her boss thinks pt is faking her episodes. Has hx with unresolved vertigo since diagnosed  3 months ago, and happens 2-3 times a week. Has hx x hx of vertigo for several years. Has seen ENT 5-6 years ago and had negative head scans. Has done PT for this, and was taught to do those maneuvers, but has not been doing them. Gets nausea when the vertigo hits her. She does not have vertigo right now.   2- gets posterior neck pain bilaterally and feels she is getting tension headaches and admits she is stressed out. The pain does not end up radiating to her head. She admits of having nausea and photophobia. Last night she took a pain reliever and after one hour resolved. While havfing the HA's and laying down makes the occipital and neck area of pain gets worse. Mother has migraines.    3- Has been having bilateral arm and leg spasm x 7 year.  Has seen a neurologist twice  several years ago with negative work up including head scan and EEG. Her last episode was 4 days ago and her GM had to help her ambulate. The worst episode she had where she could not walk at all and needed 2 people to lift her since she did not have any strength in her legs to ambulate. Has hx of unsteadiness and nystagmus noted then.    Past Medical History:  Diagnosis Date   Seizures [facility])     Patient Active Problem List   Diagnosis Date Noted   ROM (rupture of membranes), premature 06/14/2021   Anxiety 01/06/2021   Supervision of normal pregnancy 11/13/2020   Overdose, intentional self-harm, initial encounter (HCC) 12/30/2019   Seizure disorder (HCC) 05/17/2015   Depression 05/04/2013    Past Surgical History:  Procedure Laterality Date    EYE SURGERY      OB History     Gravida  1   Para  1   Term  1   Preterm      AB      Living  1      SAB      IAB      Ectopic      Multiple  0   Live Births  1            Home Medications    Prior to Admission medications   Medication Sig Start Date End Date Taking? Authorizing Provider  etonogestrel (NEXPLANON) 68 MG IMPL implant 1 each by Subdermal route once.   Yes [provider]  venlafaxine (EFFEXOR) 37.5 MG tablet Take 37.5 mg by mouth daily. 11/16/18 06/12/19  [provider]    Family History Family History  Problem Relation Age of Onset   Hypertension Father     Social History Social History   Tobacco Use   Smoking status: Every Day    Packs/day: 0.50    Types: Cigarettes   Smokeless tobacco: Never  Vaping Use   Vaping Use: Never used  Substance Use Topics   Alcohol use: Yes    Comment: occasionally (not during pregnancy per pt)   Drug use: No  Allergies   Patient has no known allergies.   Review of Systems Review of Systems  Constitutional:  Positive for activity change. Negative for appetite change, diaphoresis, fatigue and fever.  HENT:  Negative for congestion and hearing loss.   Eyes:  Positive for photophobia. Negative for pain, discharge, redness, itching and visual disturbance.  Respiratory:  Negative for cough, chest tightness and shortness of breath.   Cardiovascular:  Negative for chest pain.  Endocrine: Negative for polydipsia and polyphagia.  Musculoskeletal:  Positive for gait problem. Negative for arthralgias, back pain, myalgias and neck pain.  Skin:  Negative for rash and wound.  Neurological:  Positive for dizziness, weakness and headaches. Negative for tremors, syncope, facial asymmetry, speech difficulty and light-headedness.  Hematological:  Negative for adenopathy.     Physical Exam Triage Vital Signs ED Triage Vitals  Enc Vitals Group     BP 08/03/22 1132 114/71     Pulse Rate  08/03/22 1132 83     Resp 08/03/22 1132 18     Temp --      Temp src --      SpO2 08/03/22 1132 100 %     Weight 08/03/22 1131 150 lb (68 kg)     Height 08/03/22 1131 5\' 3"  (1.6 m)     Head Circumference --      Peak Flow --      Pain Score 08/03/22 1131 7     Pain Loc --      Pain Edu? --      Excl. in GC? --    No data found.  Updated Vital Signs BP 114/71 (BP Location: Left Arm)   Pulse 83   Resp 18   Ht 5\' 3"  (1.6 m)   Wt 150 lb (68 kg)   LMP 08/03/2022   SpO2 100%   BMI 26.57 kg/m   Visual Acuity Right Eye Distance:   Left Eye Distance:   Bilateral Distance:    Right Eye Near:   Left Eye Near:    Bilateral Near:     Physical Exam Vitals and nursing note reviewed.  Constitutional:      General: She is not in acute distress.    Appearance: She is not ill-appearing, toxic-appearing or diaphoretic.  HENT:     Head: Normocephalic.     Right Ear: Tympanic membrane, ear canal and external ear normal.     Left Ear: Tympanic membrane, ear canal and external ear normal.     Nose: Nose normal.     Mouth/Throat:     Mouth: Mucous membranes are moist.  Eyes:     General: No scleral icterus.    Extraocular Movements: Extraocular movements intact.     Conjunctiva/sclera: Conjunctivae normal.     Pupils: Pupils are equal, round, and reactive to light.     Comments: + nystagmus  Neck:     Comments: Her thyroid feels a little enlarged Cardiovascular:     Rate and Rhythm: Normal rate and regular rhythm.     Pulses: Normal pulses.     Heart sounds: No murmur heard. Pulmonary:     Effort: Pulmonary effort is normal.     Breath sounds: Normal breath sounds.  Musculoskeletal:        General: No tenderness. Normal range of motion.     Cervical back: Neck supple. No rigidity or tenderness.     Right lower leg: No edema.     Left lower leg: No edema.  Skin:  General: Skin is warm and dry.     Capillary Refill: Capillary refill takes less than 2 seconds.      Findings: No rash.  Neurological:     Mental Status: She is alert and oriented to person, place, and time.     Sensory: No sensory deficit.     Motor: No weakness.     Gait: Gait normal.     Comments: She is unsteady with heal to toe gait, romberg neg but is wabbly Tricep reflex +2/4 bilaterally, bicep +1/4 bilaterally, patella and achillis +2/4 bilateral She gets shakie when I test her strength.   Psychiatric:        Mood and Affect: Mood normal.        Behavior: Behavior normal.        Thought Content: Thought content normal.        Judgment: Judgment normal.      UC Treatments / Results  Labs (all labs ordered are listed, but only abnormal results are displayed) Labs Reviewed  COMPREHENSIVE METABOLIC PANEL - Abnormal; Notable for the following components:      Result Value   AST 12 (*)    Anion gap 4 (*)    All other components within normal limits  CBC WITH DIFFERENTIAL/PLATELET  MAGNESIUM  TSH  T4, FREE  T3, FREE  CBC and magnesium are normal.   EKG   Radiology No results found.  Procedures Procedures (including critical care time)  Medications Ordered in UC Medications - No data to display  Initial Impression / Assessment and Plan / UC Course  I have reviewed the triage vital signs and the nursing notes.  Pertinent labs  results that were available during my care of the patient were reviewed by me and considered in my medical decision making (see chart for details).  Chronic vertigo and extremity spasm Abnormal neuro exam  Needs to Fu with neurologist, but if she has another episode where she can't walk, she needs to be taken to ER right away. Pt understands. Pt asked me to write a work note with details so her boss will believe her.    Final Clinical Impressions(s) / UC Diagnoses   Final diagnoses:  Abnormal neurological exam  Muscle twitching  Thyromegaly  History of vertigo  Tension headache     Discharge Instructions      I will inform  you of your results via Mychart. If you get another episode where you can't walk, please go to ER right away.  Follow up with Neurology as soon as they can see you since you are getting worse.   Do the PT maneuvers you were taught in physical therapy in the past when you get the vertigo  [facility] neurology [address] [place], [place] [address] Get Directions Appointments [phone]  [facility] DEPARTMENT OF NEUROLOGY Administrative Office: Physicians Office Building [address], [address] [place], [place] [address] [phone]     ED Prescriptions   None    PDMP not reviewed this encounter.   Garey Ham, [place] 08/03/22 1926

## 2022-08-03 NOTE — Discharge Instructions (Addendum)
I will inform you of your results via Mychart. If you get another episode where you can't walk, please go to ER right away.  Follow up with Neurology as soon as they can see you since you are getting worse.   Do the PT maneuvers you were taught in physical therapy in the past when you get the vertigo  [facility] neurology [address] [place], [place] [address] Get Directions Appointments [phone]  [facility] DEPARTMENT OF NEUROLOGY Administrative Office: Physicians Office Building [address], [address] [place], [place] [address] [phone]

## 2022-08-04 LAB — T3, FREE: T3, Free: 3.4 pg/mL (ref 2.0–4.4)

## 2022-08-13 ENCOUNTER — Encounter (HOSPITAL_COMMUNITY): Payer: Self-pay

## 2022-12-31 ENCOUNTER — Encounter: Payer: Self-pay | Admitting: Emergency Medicine

## 2022-12-31 ENCOUNTER — Ambulatory Visit
Admission: EM | Admit: 2022-12-31 | Discharge: 2022-12-31 | Disposition: A | Discharge status: Home / Self Care | Payer: Medicaid Other | Attending: Emergency Medicine | Admitting: Emergency Medicine

## 2022-12-31 DIAGNOSIS — J069 Acute upper respiratory infection, unspecified: Secondary | ICD-10-CM

## 2022-12-31 MED ORDER — AZITHROMYCIN 250 MG PO TABS: 250.0000 mg | ORAL_TABLET | Freq: Every day | ORAL | 0 refills | Status: DC

## 2022-12-31 MED ORDER — ALBUTEROL SULFATE HFA 108 (90 BASE) MCG/ACT IN AERS: 2.0000 | INHALATION_SPRAY | RESPIRATORY_TRACT | 0 refills | Status: DC | PRN

## 2022-12-31 MED ORDER — PREDNISONE 20 MG PO TABS: 40.0000 mg | ORAL_TABLET | Freq: Every day | ORAL | 0 refills | Status: DC

## 2022-12-31 NOTE — ED Triage Notes (Signed)
Pt presents with a cough, SOB and congestion x 1 week.

## 2022-12-31 NOTE — ED Provider Notes (Signed)
[facility]    CSN: [id number] Arrival date & time: 12/31/22  1344      History   Chief Complaint Chief Complaint  Patient presents with   Nasal Congestion   Shortness of Breath    HPI Jacqueline Krueger is a 27 y.o. female.  , nyquilm motrin, mucinex,  Patient presents for evaluation of fever, nasal congestion, rhinorrhea, cough, and nausea without vomiting beginning 7 days ago.  Shortness of breath with exertion beginning 3 days ago.  Fever peaking at 102.7.  Cough is productive only in the mornings, dry throughout the day, worse at nighttime interfering with sleep.  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  Denies respiratory history.  Daily tobacco use.  Denies ear pain, sore throat, wheezing, abdominal pain or diarrhea.   Past Medical History:  Diagnosis Date   Seizures     Patient Active Problem List   Diagnosis Date Noted   ROM (rupture of membranes), premature 06/14/2021   Anxiety 01/06/2021   Supervision of normal pregnancy 11/13/2020   Overdose, intentional self-harm, initial encounter 12/30/2019   Seizure disorder 05/17/2015   Depression 05/04/2013    Past Surgical History:  Procedure Laterality Date   EYE SURGERY      OB History     Gravida  1   Para  1   Term  1   Preterm      AB      Living  1      SAB      IAB      Ectopic      Multiple  0   Live Births  1            Home Medications    Prior to Admission medications   Medication Sig Start Date End Date Taking? Authorizing Provider  etonogestrel (NEXPLANON) 68 MG IMPL implant 1 each by Subdermal route once.    [provider]  venlafaxine (EFFEXOR) 37.5 MG tablet Take 37.5 mg by mouth daily. 11/16/18 06/12/19  [provider]    Family History Family History  Problem Relation Age of Onset   Hypertension Father     Social History Social History   Tobacco Use   Smoking status: Every Day    Packs/day: .5    Types:  Cigarettes   Smokeless tobacco: Never  Vaping Use   Vaping Use: Never used  Substance Use Topics   Alcohol use: Yes    Comment: occasionally (not during pregnancy per pt)   Drug use: No     Allergies   Patient has no known allergies.   Review of Systems Review of Systems  Constitutional:  Positive for fever. Negative for activity change, appetite change, chills, diaphoresis, fatigue and unexpected weight change.  HENT:  Positive for congestion and rhinorrhea. Negative for dental problem, drooling, ear discharge, ear pain, facial swelling, hearing loss, mouth sores, nosebleeds, postnasal drip, sinus pressure, sinus pain, sneezing, sore throat, tinnitus, trouble swallowing and voice change.   Respiratory:  Positive for cough and shortness of breath. Negative for apnea, choking, chest tightness, wheezing and stridor.   Gastrointestinal:  Positive for nausea. Negative for abdominal distention, abdominal pain, anal bleeding, blood in stool, constipation, diarrhea, rectal pain and vomiting.     Physical Exam Triage Vital Signs ED Triage Vitals  Enc Vitals Group     BP 12/31/22 1442 101/75     Pulse Rate 12/31/22 1442 94     Resp 12/31/22 1442 16  Temp 12/31/22 1442 97.7 F (36.5 C)     Temp Source 12/31/22 1442 Oral     SpO2 12/31/22 1442 100 %     Weight --      Height --      Head Circumference --      Peak Flow --      Pain Score 12/31/22 1441 0     Pain Loc --      Pain Edu? --      Excl. in GC? --    No data found.  Updated Vital Signs BP 101/75 (BP Location: Left Arm)   Pulse 94   Temp 97.7 F (36.5 C) (Oral)   Resp 16   LMP 12/29/2022   SpO2 100%   Visual Acuity Right Eye Distance:   Left Eye Distance:   Bilateral Distance:    Right Eye Near:   Left Eye Near:    Bilateral Near:     Physical Exam Constitutional:      Appearance: Normal appearance.  HENT:     Head: Normocephalic.     Right Ear: Tympanic membrane, ear canal and external ear  normal.     Left Ear: Tympanic membrane, ear canal and external ear normal.     Nose: Congestion and rhinorrhea present.     Mouth/Throat:     Mouth: Mucous membranes are moist.     Pharynx: No posterior oropharyngeal erythema.  Cardiovascular:     Rate and Rhythm: Normal rate and regular rhythm.     Pulses: Normal pulses.     Heart sounds: Normal heart sounds.  Pulmonary:     Effort: Pulmonary effort is normal.     Comments: Wheezing  upper lobes, lower lobes clear   Neurological:     Mental Status: She is alert and oriented to person, place, and time. Mental status is at baseline.      UC Treatments / Results  Labs (all labs ordered are listed, but only abnormal results are displayed) Labs Reviewed - No data to display  EKG   Radiology No results found.  Procedures Procedures (including critical care time)  Medications Ordered in UC Medications - No data to display  Initial Impression / Assessment and Plan / UC Course  I have reviewed the triage vital signs and the nursing notes.  Pertinent labs & imaging results that were available during my care of the patient were reviewed by me and considered in my medical decision making (see chart for details).  Acute upper respiratory infection  Vital signs are stable, patient is in no signs of distress nontoxic-appearing, wheezing is heard to the upper lobes, O2 saturation 100% on room air, low suspicion for pneumonia therefore imaging deferred, with daily tobacco use possibly bronchitis therefore we will provide bacterial coverage, azithromycin prescribed as well as prednisone and albuterol inhaler, declined prescription for cough medicine as it is not covered by insurance, recommended over-the-counter options and advise follow-up with urgent care as needed if symptoms persist or worsen Final Clinical Impressions(s) / UC Diagnoses   Final diagnoses:  None   Discharge Instructions   None    ED Prescriptions   None     PDMP not reviewed this encounter.   Valinda Hoar, NP 12/31/22 1514

## 2022-12-31 NOTE — Discharge Instructions (Signed)
Patient experiencing shortness of breath, unable to hear wheezing on her exam we will provide antibiotic to provide bacterial coverage, take azithromycin as directed  Starting tomorrow take prednisone every morning with food for 5 days, this medicine helps to relax the airway making it easier for you to breathe  You may use the albuterol inhaler taking 2 puffs every 4 hours as needed for shortness of breath  You may continue use over-the-counter medicines if helpful, may also attempt any of the following below    You can take Tylenol and/or Ibuprofen as needed for fever reduction and pain relief.   For cough: honey 1/2 to 1 teaspoon (you can dilute the honey in water or another fluid).  You can also use guaifenesin and dextromethorphan for cough. You can use a humidifier for chest congestion and cough.  If you don't have a humidifier, you can sit in the bathroom with the hot shower running.      For sore throat: try warm salt water gargles, cepacol lozenges, throat spray, warm tea or water with lemon/honey, popsicles or ice, or OTC cold relief medicine for throat discomfort.   For congestion: take a daily anti-histamine like Zyrtec, Claritin, and a oral decongestant, such as pseudoephedrine.  You can also use Flonase 1-2 sprays in each nostril daily.   It is important to stay hydrated: drink plenty of fluids (water, gatorade/powerade/pedialyte, juices, or teas) to keep your throat moisturized and help further relieve irritation/discomfort.

## 2023-04-01 ENCOUNTER — Telehealth: Payer: Self-pay | Admitting: Licensed Clinical Social Worker

## 2023-04-01 NOTE — Telephone Encounter (Signed)
Referred by Robin Swaz[place], RN Care Manager for positive depression screening.

## 2023-04-19 ENCOUNTER — Encounter: Payer: Self-pay | Admitting: Emergency Medicine

## 2023-04-19 ENCOUNTER — Ambulatory Visit
Admission: EM | Admit: 2023-04-19 | Discharge: 2023-04-19 | Disposition: A | Discharge status: Home / Self Care | Payer: Medicaid Other | Attending: Emergency Medicine | Admitting: Emergency Medicine

## 2023-04-19 DIAGNOSIS — K047 Periapical abscess without sinus: Secondary | ICD-10-CM

## 2023-04-19 MED ORDER — AMOXICILLIN-POT CLAVULANATE 875-125 MG PO TABS: 1.0000 | ORAL_TABLET | Freq: Two times a day (BID) | ORAL | 0 refills | Status: AC

## 2023-04-19 NOTE — ED Provider Notes (Signed)
[facility]    CSN: [id number] Arrival date & time: 04/19/23  1627      History   Chief Complaint Chief Complaint  Patient presents with   Dental Pain    HPI Jacqueline Krueger is a 27 y.o. female.   HPI  27 year old female with a past medical history significant for seizures, depression, and anxiety presents for evaluation of right upper dental pain.  She reports that she has had a pain in her right upper rear molar for the last week.  No fever or drainage.  It is sensitive to both temperature and pressure.  The tooth is broken, and the patient reports that has been broken for a while.  She has a dentist appointment scheduled for 1 week from today.  Past Medical History:  Diagnosis Date   Seizures [facility])     Patient Active Problem List   Diagnosis Date Noted   ROM (rupture of membranes), premature 06/14/2021   Anxiety 01/06/2021   Supervision of normal pregnancy 11/13/2020   Overdose, intentional self-harm, initial encounter (HCC) 12/30/2019   Seizure disorder (HCC) 05/17/2015   Depression 05/04/2013    Past Surgical History:  Procedure Laterality Date   EYE SURGERY      OB History     Gravida  1   Para  1   Term  1   Preterm      AB      Living  1      SAB      IAB      Ectopic      Multiple  0   Live Births  1            Home Medications    Prior to Admission medications   Medication Sig Start Date End Date Taking? Authorizing Provider  amoxicillin-clavulanate (AUGMENTIN) 875-125 MG tablet Take 1 tablet by mouth every 12 (twelve) hours for 10 days. 04/19/23 04/29/23 Yes Becky Augusta, NP  albuterol (VENTOLIN HFA) 108 (90 Base) MCG/ACT inhaler Inhale 2 puffs into the lungs every 4 (four) hours as needed for wheezing or shortness of breath. 12/31/22   Valinda Hoar, NP  etonogestrel (NEXPLANON) 68 MG IMPL implant 1 each by Subdermal route once.    [provider]  predniSONE (DELTASONE) 20 MG tablet Take 2 tablets (40  mg total) by mouth daily. 12/31/22   Valinda Hoar, NP  venlafaxine (EFFEXOR) 37.5 MG tablet Take 37.5 mg by mouth daily. 11/16/18 06/12/19  [provider]    Family History Family History  Problem Relation Age of Onset   Hypertension Father     Social History Social History   Tobacco Use   Smoking status: Every Day    Current packs/day: 0.50    Types: Cigarettes   Smokeless tobacco: Never  Vaping Use   Vaping status: Never Used  Substance Use Topics   Alcohol use: Yes    Comment: occasionally (not during pregnancy per pt)   Drug use: No     Allergies   Patient has no known allergies.   Review of Systems Review of Systems  Constitutional:  Negative for fever.  HENT:  Positive for dental problem.      Physical Exam Triage Vital Signs ED Triage Vitals  Encounter Vitals Group     BP 04/19/23 1645 106/85     Systolic BP Percentile --      Diastolic BP Percentile --      Pulse Rate 04/19/23 1645 88  Resp 04/19/23 1645 16     Temp 04/19/23 1645 98.4 F (36.9 C)     Temp Source 04/19/23 1645 Oral     SpO2 04/19/23 1645 99 %     Weight --      Height --      Head Circumference --      Peak Flow --      Pain Score 04/19/23 1644 6     Pain Loc --      Pain Education --      Exclude from Growth Chart --    No data found.  Updated Vital Signs BP 106/85 (BP Location: Left Arm)   Pulse 88   Temp 98.4 F (36.9 C) (Oral)   Resp 16   SpO2 99%   Visual Acuity Right Eye Distance:   Left Eye Distance:   Bilateral Distance:    Right Eye Near:   Left Eye Near:    Bilateral Near:     Physical Exam Vitals and nursing note reviewed.  Constitutional:      Appearance: Normal appearance. She is not ill-appearing.  HENT:     Head: Normocephalic and atraumatic.     Mouth/Throat:     Mouth: Mucous membranes are moist.     Pharynx: Oropharynx is clear. No oropharyngeal exudate or posterior oropharyngeal erythema.     Comments: Patient's right upper  third molar is fractured.  There is no erythema noted in the surrounding gum tissue and no drainage.  The tooth is tender to percussion.  No facial swelling noted. Skin:    General: Skin is warm and dry.     Capillary Refill: Capillary refill takes less than 2 seconds.     Findings: No erythema.  Neurological:     General: No focal deficit present.     Mental Status: She is alert and oriented to person, place, and time.      UC Treatments / Results  Labs (all labs ordered are listed, but only abnormal results are displayed) Labs Reviewed - No data to display  EKG   Radiology No results found.  Procedures Procedures (including critical care time)  Medications Ordered in UC Medications - No data to display  Initial Impression / Assessment and Plan / UC Course  I have reviewed the triage vital signs and the nursing notes.  Pertinent labs & imaging results that were available during my care of the patient were reviewed by me and considered in my medical decision making (see chart for details).   Patient is a nontoxic-appearing 27 year old female presenting for evaluation of right-sided dental pain as outlined in HPI above.  The tooth in question is her right upper third molar and it is fractured but there is no appreciable drainage.  The surrounding gum tissue is not erythematous or edematous.  No facial swelling noted.  The tooth is tender to percussion.  I suspect that she has a dental abscess as a result of the open dental fracture.  She does have a scheduled dentist appointment for 1 week from today.  I will start her on Augmentin 875 twice daily with food for 10 days for treatment of the dental infection.  She should use over-the-counter Tylenol and or ibuprofen, along with food grape clove oil, to help her dental pain.  If her pain worsens, she develops facial swelling, or she develops a fever I have given her the contact information for the dental urgent care at [facility] as  well as suggested  the emergency room at [facility] where she can be evaluated by the dentist and/or oral surgeon on-call.   Final Clinical Impressions(s) / UC Diagnoses   Final diagnoses:  Dental infection     Discharge Instructions      Take the Augmentin twice daily with food for 10 days for treatment of your dental infection.  Use over-the-counter Tylenol and ibuprofen for swelling and mild to moderate pain.  Apply food grade clove oil to your tooth to help with pain.  Rinse with warm salt water, or Listerine, after each meal to remove food particles and wash away any pus that is collecting.  If you develop any increasing or swelling, fever, pain, or difficulty swallowing you to go to the emergency department at [facility] with a have an oral surgeon and also a dentist on-call.      ED Prescriptions     Medication Sig Dispense Auth. Provider   amoxicillin-clavulanate (AUGMENTIN) 875-125 MG tablet Take 1 tablet by mouth every 12 (twelve) hours for 10 days. 20 tablet Becky Augusta, NP      PDMP not reviewed this encounter.   Becky Augusta, NP 04/19/23 [phone]

## 2023-04-19 NOTE — ED Triage Notes (Signed)
Pt presents with right side upper dental pain x 1 week. She developed some swelling yesterday. Pt does have a dentist appointment scheduled.

## 2023-04-19 NOTE — Discharge Instructions (Signed)
Take the Augmentin twice daily with food for 10 days for treatment of your dental infection.  Use over-the-counter Tylenol and ibuprofen for swelling and mild to moderate pain.  Apply food grade clove oil to your tooth to help with pain.  Rinse with warm salt water, or Listerine, after each meal to remove food particles and wash away any pus that is collecting.  If you develop any increasing or swelling, fever, pain, or difficulty swallowing you to go to the emergency department at [facility] with a have an oral surgeon and also a dentist on-call.

## 2023-04-28 ENCOUNTER — Telehealth: Payer: Self-pay | Admitting: Licensed Clinical Social Worker

## 2023-04-28 NOTE — Telephone Encounter (Signed)
Re-referred by Robin Swaz[place], RN Care Manager.

## 2023-05-13 ENCOUNTER — Ambulatory Visit: Payer: Medicaid Other | Admitting: Licensed Clinical Social Worker

## 2023-05-13 DIAGNOSIS — F411 Generalized anxiety disorder: Secondary | ICD-10-CM

## 2023-05-13 DIAGNOSIS — F330 Major depressive disorder, recurrent, mild: Secondary | ICD-10-CM
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
## 2023-05-27 ENCOUNTER — Ambulatory Visit: Payer: Medicaid Other | Admitting: Licensed Clinical Social Worker

## 2023-05-27 NOTE — Progress Notes (Unsigned)
Counselor/Therapist Progress Note  Patient ID: Jacqueline Krueger, MRN: [id number],    Date: 05/27/2023  Time Spent: ***   Treatment Type: Psychotherapy  Reported Symptoms: {CHL AMB Reported Symptoms:[phone]}  Mental Status Exam:  Appearance:   {PSY:22683}     Behavior:  {PSY:21022743}  Motor:  {PSY:22302}  Speech/Language:   {PSY:22685}  Affect:  {PSY:22687}  Mood:  {PSY:31886}  Thought process:  {PSY:31888}  Thought content:    {PSY:740-414-1105}  Sensory/Perceptual disturbances:    {PSY:309-176-7596}  Orientation:  {PSY:30297}  Attention:  {PSY:22877}  Concentration:  {PSY:660-629-8396}  Memory:  {[phone]}  Fund of knowledge:   {PSY:660-629-8396}  Insight:    {PSY:660-629-8396}  Judgment:   {PSY:660-629-8396}  Impulse Control:  {PSY:660-629-8396}   Risk Assessment: Danger to Self:  {PSY:22692} Self-injurious Behavior: {PSY:22692} Danger to Others: {PSY:22692} Duty to Warn:{PSY:311194} Physical Aggression / Violence:{PSY:21197} Access to Firearms a concern: {PSY:21197} Gang Involvement:{PSY:21197}  Subjective: Patient was engaged and cooperative throughout the session using time effectively to discuss   Patient voices continued motivation for treatment and understanding of  . Patient is likely to benefit from future treatment because  remains motivated to decrease  And   and reports benefit of regular sessions in addressing these symptoms.     Interventions: Cognitive Behavioral Therapy and Client Centered   Checked in with patient and reviewed previous session, including assessment and goal of treatment. Reviewed CBTs. Explored patient's goal of treatment and worked collaboratively to develop CBT treatment plan. Provided support through active listening, validation of feelings, and highlighted patient's strengths.    Diagnosis:   ICD-10-CM   1. Mild episode of recurrent major depressive disorder (HCC)  F33.0     2. Generalized anxiety disorder  F41.1       Plan:  Patient's goal of treatment    Darron Doom, MSW [facility] Intern present with patient's verbal consent.   Kathreen Cosier, LCSW

## 2024-06-17 ENCOUNTER — Ambulatory Visit
Admission: EM | Admit: 2024-06-17 | Discharge: 2024-06-17 | Disposition: A | Discharge status: Home / Self Care | Attending: Family Medicine | Admitting: Family Medicine

## 2024-06-17 ENCOUNTER — Encounter: Payer: Self-pay | Admitting: Emergency Medicine

## 2024-06-17 DIAGNOSIS — J069 Acute upper respiratory infection, unspecified: Secondary | ICD-10-CM | POA: Insufficient documentation

## 2024-06-17 LAB — RESP PANEL BY RT-PCR (FLU A&B, COVID) ARPGX2
Influenza A by PCR: NEGATIVE
Influenza B by PCR: NEGATIVE
SARS Coronavirus 2 by RT PCR: NEGATIVE

## 2024-06-17 MED ORDER — ALBUTEROL SULFATE HFA 108 (90 BASE) MCG/ACT IN AERS: 2.0000 | INHALATION_SPRAY | RESPIRATORY_TRACT | 0 refills | Status: AC | PRN

## 2024-06-17 NOTE — ED Triage Notes (Signed)
 Patient c/o cough, congestion and bodyaches that started 2 days ago.  Patient unsure of fevers.

## 2024-06-17 NOTE — ED Provider Notes (Signed)
 [facility]    CSN: [id number] Arrival date & time: 06/17/24  0818      History   Chief Complaint Chief Complaint  Patient presents with   Generalized Body Aches   Cough    HPI Jacqueline Krueger is a 28 y.o. female.   HPI  History obtained from the patient. Jacqueline Krueger presents for body aches, nasal congestion, sneezing, cough, intermittent shortness of breath that started 2 days ago.  She is unsure if she had fever.  No ear pain, sore throat, vomiting, diarrhea or abdominal pain. No known sick contacts. No medications prior to arrival.    No history of asthma. Denies vaping but has been smoking cigarettes.       Past Medical History:  Diagnosis Date   Seizures [facility])     Patient Active Problem List   Diagnosis Date Noted   Generalized anxiety disorder 05/13/2023   ROM (rupture of membranes), premature 06/14/2021   Anxiety 01/06/2021   Supervision of normal pregnancy 11/13/2020   Overdose, intentional self-harm, initial encounter (HCC) 12/30/2019   Seizure disorder (HCC) 05/17/2015   Depression 05/04/2013    Past Surgical History:  Procedure Laterality Date   EYE SURGERY      OB History     Gravida  1   Para  1   Term  1   Preterm      AB      Living  1      SAB      IAB      Ectopic      Multiple  0   Live Births  1            Home Medications    Prior to Admission medications   Medication Sig Start Date End Date Taking? Authorizing Provider  etonogestrel  (NEXPLANON ) 68 MG IMPL implant 1 each by Subdermal route once.   Yes [provider]  albuterol  (VENTOLIN  HFA) 108 (90 Base) MCG/ACT inhaler Inhale 2 puffs into the lungs every 4 (four) hours as needed for wheezing or shortness of breath. 06/17/24   Calayah Guadarrama, DO  venlafaxine (EFFEXOR) 37.5 MG tablet Take 37.5 mg by mouth daily. 11/16/18 06/12/19  [provider]    Family History Family History  Problem Relation Age of Onset   Hypertension  Father     Social History Social History   Tobacco Use   Smoking status: Every Day    Current packs/day: 0.50    Types: Cigarettes   Smokeless tobacco: Never  Vaping Use   Vaping status: Never Used  Substance Use Topics   Alcohol use: Yes    Comment: occasionally (not during pregnancy per pt)   Drug use: No     Allergies   Patient has no known allergies.   Review of Systems Review of Systems: negative unless otherwise stated in HPI.      Physical Exam Triage Vital Signs ED Triage Vitals  Encounter Vitals Group     BP 06/17/24 0831 121/74     Girls Systolic BP Percentile --      Girls Diastolic BP Percentile --      Boys Systolic BP Percentile --      Boys Diastolic BP Percentile --      Pulse Rate 06/17/24 0831 (!) 120     Resp 06/17/24 0831 14     Temp 06/17/24 0831 98 F (36.7 C)     Temp Source 06/17/24 0831 Oral     SpO2 06/17/24  0831 98 %     Weight 06/17/24 0829 150 lb (68 kg)     Height 06/17/24 0829 5' 3 (1.6 m)     Head Circumference --      Peak Flow --      Pain Score 06/17/24 0829 5     Pain Loc --      Pain Education --      Exclude from Growth Chart --    No data found.  Updated Vital Signs BP 121/74 (BP Location: Left Arm)   Pulse (!) 120   Temp 98 F (36.7 C) (Oral)   Resp 14   Ht 5' 3 (1.6 m)   Wt 68 kg   SpO2 98%   BMI 26.57 kg/m   Visual Acuity Right Eye Distance:   Left Eye Distance:   Bilateral Distance:    Right Eye Near:   Left Eye Near:    Bilateral Near:     Physical Exam GEN:     alert, non-toxic appearing female in no distress    HENT:  mucus membranes moist, oropharyngeal without lesions, moderate erythema, no tonsillar hypertrophy or exudates,  clear nasal discharge EYES:  no scleral injection or discharge NECK:  normal ROM, no meningismus   RESP:  no increased work of breathing, wheezing and coarse breathe sounds bilaterally CVS:   regular rhythm, tachycardic  Skin:   warm and dry, no rash on visible  skin    UC Treatments / Results  Labs (all labs ordered are listed, but only abnormal results are displayed) Labs Reviewed  RESP PANEL BY RT-PCR (FLU A&B, COVID) [id number]    EKG   Radiology No results found.   Procedures Procedures (including critical care time)  Medications Ordered in UC Medications - No data to display  Initial Impression / Assessment and Plan / UC Course  I have reviewed the triage vital signs and the nursing notes.  Pertinent labs & imaging results that were available during my care of the patient were reviewed by me and considered in my medical decision making (see chart for details).       Pt is a 28 y.o. female who presents for 2 days of respiratory symptoms. Jacqueline Krueger is afebrile here without recent antipyretics. She is tachycardic but normotensive. Satting well on room air. Overall pt is non-toxic appearing, well hydrated, without respiratory distress. Pulmonary exam is remarkable for coarse breathe sounds and expiratory wheezing.  COVID and influenza panel obtained and was negative. Suspect viral respiratory illness. Discussed symptomatic treatment.  Albuterol  inhaler prescribed. Explained lack of efficacy of antibiotics in viral disease.  Typical duration of symptoms discussed.   Return and ED precautions given and voiced understanding. Discussed MDM, treatment plan and plan for follow-up with patient who agrees with plan.     Final Clinical Impressions(s) / UC Diagnoses   Final diagnoses:  Viral URI with cough     Discharge Instructions      Your influenza and COVID are negative. You have a viral respiratory infection that will gradually improve over the next 7-10 days. Cough may last up to 3 weeks.   You can take Tylenol  and/or Ibuprofen  as needed for fever reduction and pain relief.    For cough: honey 1/2 to 1 teaspoon (you can dilute the honey in water or another fluid). You can also use guaifenesin (Mucinex) and dextromethorphan  (Delsym) for cough.  You can use a humidifier for chest congestion and cough.  If you don't have a  humidifier, you can sit in the bathroom with the hot shower running.      For sore throat: try warm salt water gargles, Mucinex sore throat cough drops or cepacol lozenges, throat spray, warm tea or water with lemon/honey, popsicles or ice, or OTC cold relief medicine for throat discomfort. You can also purchase chloraseptic spray at the pharmacy or dollar store.    For congestion: take a daily anti-histamine like Zyrtec, Claritin, and a oral decongestant, such as pseudoephedrine.  You can also use Flonase 1-2 sprays in each nostril daily. Afrin is also a good option, if you do not have high blood pressure.    It is important to stay hydrated: drink plenty of fluids (water, gatorade/powerade/pedialyte, juices, or teas) to keep your throat moisturized and help further relieve irritation/discomfort.    Return or go to the Emergency Department if symptoms worsen or do not improve in the next few days       ED Prescriptions     Medication Sig Dispense Auth. Provider   albuterol  (VENTOLIN  HFA) 108 (90 Base) MCG/ACT inhaler Inhale 2 puffs into the lungs every 4 (four) hours as needed for wheezing or shortness of breath. 6.7 g Kendarius Vigen, DO      PDMP not reviewed this encounter.   Kriste Berth, DO 06/17/24 [phone]

## 2024-06-17 NOTE — Discharge Instructions (Addendum)
 Your influenza and COVID are negative. You have a viral respiratory infection that will gradually improve over the next 7-10 days. Cough may last up to 3 weeks.   You can take Tylenol  and/or Ibuprofen  as needed for fever reduction and pain relief.    For cough: honey 1/2 to 1 teaspoon (you can dilute the honey in water or another fluid). You can also use guaifenesin (Mucinex) and dextromethorphan (Delsym) for cough.  You can use a humidifier for chest congestion and cough.  If you don't have a humidifier, you can sit in the bathroom with the hot shower running.      For sore throat: try warm salt water gargles, Mucinex sore throat cough drops or cepacol lozenges, throat spray, warm tea or water with lemon/honey, popsicles or ice, or OTC cold relief medicine for throat discomfort. You can also purchase chloraseptic spray at the pharmacy or dollar store.    For congestion: take a daily anti-histamine like Zyrtec, Claritin, and a oral decongestant, such as pseudoephedrine.  You can also use Flonase 1-2 sprays in each nostril daily. Afrin is also a good option, if you do not have high blood pressure.    It is important to stay hydrated: drink plenty of fluids (water, gatorade/powerade/pedialyte, juices, or teas) to keep your throat moisturized and help further relieve irritation/discomfort.    Return or go to the Emergency Department if symptoms worsen or do not improve in the next few days
